# Patient Record
Sex: Female | Born: 1955
Health system: Southern US, Community
[De-identification: ages and names within clinical notes are randomized; demographics above are authoritative.]

## PROBLEM LIST (undated history)

## (undated) DIAGNOSIS — F419 Anxiety disorder, unspecified: Secondary | ICD-10-CM

## (undated) DIAGNOSIS — I10 Essential (primary) hypertension: Secondary | ICD-10-CM

## (undated) DIAGNOSIS — M47816 Spondylosis without myelopathy or radiculopathy, lumbar region: Secondary | ICD-10-CM

## (undated) DIAGNOSIS — C4490 Unspecified malignant neoplasm of skin, unspecified: Secondary | ICD-10-CM

## (undated) DIAGNOSIS — Z8619 Personal history of other infectious and parasitic diseases: Secondary | ICD-10-CM

## (undated) DIAGNOSIS — M1610 Unilateral primary osteoarthritis, unspecified hip: Secondary | ICD-10-CM

## (undated) DIAGNOSIS — T7840XA Allergy, unspecified, initial encounter: Secondary | ICD-10-CM

## (undated) HISTORY — DX: Essential (primary) hypertension: I10

## (undated) HISTORY — DX: Anxiety disorder, unspecified: F41.9

## (undated) HISTORY — DX: Allergy, unspecified, initial encounter: T78.40XA

## (undated) HISTORY — DX: Unilateral primary osteoarthritis, unspecified hip: M16.10

## (undated) HISTORY — PX: APPENDECTOMY: SHX54

## (undated) HISTORY — PX: JOINT REPLACEMENT: SHX530

## (undated) HISTORY — DX: Spondylosis without myelopathy or radiculopathy, lumbar region: M47.816

## (undated) HISTORY — DX: Personal history of other infectious and parasitic diseases: Z86.19

## (undated) HISTORY — DX: Unspecified malignant neoplasm of skin, unspecified: C44.90

---

## 2013-09-11 ENCOUNTER — Ambulatory Visit: Payer: BC Managed Care – PPO

## 2014-01-16 ENCOUNTER — Ambulatory Visit (INDEPENDENT_AMBULATORY_CARE_PROVIDER_SITE_OTHER): Payer: BC Managed Care – PPO | Admitting: Emergency Medicine

## 2014-01-16 VITALS — BP 106/80 | HR 69 | Temp 98.3°F | Resp 18 | Ht 65.0 in | Wt 131.0 lb

## 2014-01-16 DIAGNOSIS — F411 Generalized anxiety disorder: Secondary | ICD-10-CM

## 2014-01-16 DIAGNOSIS — I10 Essential (primary) hypertension: Secondary | ICD-10-CM | POA: Insufficient documentation

## 2014-01-16 DIAGNOSIS — Z79899 Other long term (current) drug therapy: Secondary | ICD-10-CM

## 2014-01-16 DIAGNOSIS — D649 Anemia, unspecified: Secondary | ICD-10-CM | POA: Insufficient documentation

## 2014-01-16 LAB — COMPREHENSIVE METABOLIC PANEL
ALK PHOS: 68 U/L (ref 39–117)
ALT: 15 U/L (ref 0–35)
AST: 22 U/L (ref 0–37)
Albumin: 4.4 g/dL (ref 3.5–5.2)
BUN: 14 mg/dL (ref 6–23)
CO2: 28 mEq/L (ref 19–32)
CREATININE: 0.63 mg/dL (ref 0.50–1.10)
Calcium: 10.1 mg/dL (ref 8.4–10.5)
Chloride: 101 mEq/L (ref 96–112)
Glucose, Bld: 89 mg/dL (ref 70–99)
Potassium: 4.3 mEq/L (ref 3.5–5.3)
Sodium: 139 mEq/L (ref 135–145)
Total Bilirubin: 0.3 mg/dL (ref 0.2–1.2)
Total Protein: 7.1 g/dL (ref 6.0–8.3)

## 2014-01-16 LAB — POCT CBC
Granulocyte percent: 43.9 %G (ref 37–80)
HCT, POC: 33.8 % — AB (ref 37.7–47.9)
Hemoglobin: 11 g/dL — AB (ref 12.2–16.2)
LYMPH, POC: 2.8 (ref 0.6–3.4)
MCH: 32.3 pg — AB (ref 27–31.2)
MCHC: 32.5 g/dL (ref 31.8–35.4)
MCV: 99.3 fL — AB (ref 80–97)
MID (CBC): 0.5 (ref 0–0.9)
MPV: 6.4 fL (ref 0–99.8)
PLATELET COUNT, POC: 305 10*3/uL (ref 142–424)
POC Granulocyte: 2.5 (ref 2–6.9)
POC LYMPH PERCENT: 48.1 %L (ref 10–50)
POC MID %: 8 %M (ref 0–12)
RBC: 3.41 M/uL — AB (ref 4.04–5.48)
RDW, POC: 13.5 %
WBC: 5.8 10*3/uL (ref 4.6–10.2)

## 2014-01-16 LAB — FERRITIN: Ferritin: 342 ng/mL — ABNORMAL HIGH (ref 10–291)

## 2014-01-16 LAB — IRON AND TIBC
%SAT: 28 % (ref 20–55)
IRON: 98 ug/dL (ref 42–145)
TIBC: 356 ug/dL (ref 250–470)
UIBC: 258 ug/dL (ref 125–400)

## 2014-01-16 LAB — VITAMIN B12: Vitamin B-12: 564 pg/mL (ref 211–911)

## 2014-01-16 MED ORDER — PROPRANOLOL HCL 20 MG PO TABS: 20.0000 mg | ORAL_TABLET | Freq: Every day | ORAL | Status: DC

## 2014-01-16 MED ORDER — ALPRAZOLAM 0.5 MG PO TABS: ORAL_TABLET | ORAL | Status: DC

## 2014-01-16 MED ORDER — HYDROCHLOROTHIAZIDE 12.5 MG PO TABS: ORAL_TABLET | ORAL | Status: DC

## 2014-01-16 MED ORDER — LISINOPRIL 20 MG PO TABS: 20.0000 mg | ORAL_TABLET | Freq: Every day | ORAL | Status: DC

## 2014-01-16 NOTE — Progress Notes (Addendum)
Subjective:    Patient ID: Princess Bruins, female    DOB: 1955/12/17, 58 y.o.   MRN: [id number] This chart was scribed for Darlyne Russian, MD by Cathie Hoops, ED Scribe. The patient was seen in Room 2. The patient's care was started at 2:13 PM.   01/16/2014  Chief Complaint  Patient presents with  . rx refills    all in epic    HPI HPI Comments: Elanda Garmany is a 58 y.o. female who presents to the [facility] complaining of chronic HTN and anxiety. Pt notes she attempted to go to a concert in [place]. Pt notes she got stuck in traffic and ended up being 2 hours late for the concert. As she tried to return home she got lost and notes her "nerves got worked up". Pt denies having her HTN or anxiety medications at that time and her BP became elevated. Pt states she went to the ED in [place], [place] via EMS and notes she went to the hospital for HTN. Pt notes she was shaky and hands were white. Pt notes she was told to follow-up with a physician 10 days after the incident. Pt would like medication refills of Xanax, Hydrodiuril, Prinivil, and Inderal. She states she moved to [place] from [place] about 6 months ago. Pt's medication was written in [place]. She notes she monitors her BP at home with an at-home BP cuff. She denies having her potassium levels or blood-work checked recently due to lack of medical insurance. She denies any other symptoms at this time.   Review of Systems  Constitutional: Negative for fever and chills.  Cardiovascular: Negative for chest pain.  Gastrointestinal: Negative for nausea and vomiting.  Psychiatric/Behavioral: The patient is nervous/anxious.     Past Medical History  Diagnosis Date  . Allergy   . Anxiety   . Hypertension    Past Surgical History  Procedure Laterality Date  . Appendectomy     Allergies  Allergen Reactions  . Adhesive [Tape]   . Erythromycin   . Prozac [Fluoxetine Hcl] Hives  . Sulfa Antibiotics     Current Outpatient Prescriptions  Medication Sig Dispense Refill  . ALPRAZolam (XANAX) 0.5 MG tablet Take 0.5 mg by mouth 3 (three) times daily as needed for anxiety.    . hydrochlorothiazide (HYDRODIURIL) 25 MG tablet Take 25 mg by mouth daily.    Marland Kitchen lisinopril (PRINIVIL,ZESTRIL) 20 MG tablet Take 20 mg by mouth daily.    . propranolol (INDERAL) 20 MG tablet Take 20 mg by mouth daily.     No current facility-administered medications for this visit.       Objective:  Triage Vitals: BP 106/80 mmHg  Pulse 69  Temp(Src) 98.3 F (36.8 C) (Oral)  Resp 18  Ht 5\' 5"  (1.651 m)  Wt 131 lb (59.421 kg)  BMI 21.80 kg/m2  SpO2 100%  Physical Exam  Constitutional: She is oriented to person, place, and time. She appears well-developed and well-nourished. No distress.  HENT:  Head: Normocephalic and atraumatic.  Eyes: Conjunctivae and EOM are normal.  Neck: Neck supple. No tracheal deviation present.  Cardiovascular: Normal rate.   Pulmonary/Chest: Effort normal. No respiratory distress.  Musculoskeletal: Normal range of motion.  Neurological: She is alert and oriented to person, place, and time.  Skin: Skin is warm and dry.  Psychiatric: She has a normal mood and affect. Her behavior is normal.  Nursing note and vitals reviewed.   Results for orders placed or  performed in visit on 01/16/14  POCT CBC  Result Value Ref Range   WBC 5.8 4.6 - 10.2 K/uL   Lymph, poc 2.8 0.6 - 3.4   POC LYMPH PERCENT 48.1 10 - 50 %L   MID (cbc) 0.5 0 - 0.9   POC MID % 8.0 0 - 12 %M   POC Granulocyte 2.5 2 - 6.9   Granulocyte percent 43.9 37 - 80 %G   RBC 3.41 (A) 4.04 - 5.48 M/uL   Hemoglobin 11.0 (A) 12.2 - 16.2 g/dL   HCT, POC 33.8 (A) 37.7 - 47.9 %   MCV 99.3 (A) 80 - 97 fL   MCH, POC 32.3 (A) 27 - 31.2 pg   MCHC 32.5 31.8 - 35.4 g/dL   RDW, POC 13.5 %   Platelet Count, POC 305 142 - 424 K/uL   MPV 6.4 0 - 99.8 fL    Assessment & Plan:  2:32 PM- Patient informed of current plan for  treatment and evaluation and agrees with plan at this time. Her blood pressure medications were refilled. I pulled up the drug data sheet and she is not on the [facility]. I gave her #45 of Xanax 0.5 to take up to 2 a day she was given 45 tablets with one refill. She denied any suicidal thoughts she is here without family care of her pets and is looking for a job. I advised her to make an appointment next or so she can get follow-up of her anxiety stress and hypertension.I personally performed the services described in this documentation, which was scribed in my presence. The recorded information has been reviewed and is accurate. Hemoglobin was low at 11. I did give her Hemoccult cards. Iron studies were ordered along with a B12. Her MCV was 99.3 and she does have one glass of wine per night.

## 2014-01-16 NOTE — Patient Instructions (Signed)
Hypertension Hypertension, commonly called high blood pressure, is when the force of blood pumping through your arteries is too strong. Your arteries are the blood vessels that carry blood from your heart throughout your body. A blood pressure reading consists of a higher number over a lower number, such as 110/72. The higher number (systolic) is the pressure inside your arteries when your heart pumps. The lower number (diastolic) is the pressure inside your arteries when your heart relaxes. Ideally you want your blood pressure below 120/80. Hypertension forces your heart to work harder to pump blood. Your arteries may become narrow or stiff. Having hypertension puts you at risk for heart disease, stroke, and other problems.  RISK FACTORS Some risk factors for high blood pressure are controllable. Others are not. Stress Stress-related medical problems are becoming increasingly common. The body has a built-in physical response to stressful situations. Faced with pressure, challenge or danger, we need to react quickly. Our bodies release hormones such as cortisol and adrenaline to help do this. These hormones are part of the "fight or flight" response and affect the metabolic rate, heart rate and blood pressure, resulting in a heightened, stressed state that prepares the body for optimum performance in dealing with a stressful situation. It is likely that early man required these mechanisms to stay alive, but usually modern stresses do not call for this, and the same hormones released in today's world can damage health and reduce coping ability. CAUSES  Pressure to perform at work, at school or in sports.  Threats of physical violence.  Money worries.  Arguments.  Family conflicts.  Divorce or separation from significant other.  Bereavement.  New job or unemployment.  Changes in location.  Alcohol or drug abuse. SOMETIMES, THERE IS NO PARTICULAR REASON FOR DEVELOPING STRESS. Almost all  people are at risk of being stressed at some time in their lives. It is important to know that some stress is temporary and some is long term.  Temporary stress will go away when a situation is resolved. Most people can cope with short periods of stress, and it can often be relieved by relaxing, taking a walk or getting any type of exercise, chatting through issues with friends, or having a good night's sleep.  Chronic (long-term, continuous) stress is much harder to deal with. It can be psychologically and emotionally damaging. It can be harmful both for an individual and for friends and family. SYMPTOMS Everyone reacts to stress differently. There are some common effects that help [place] recognize it. In times of extreme stress, people may:  Shake uncontrollably.  Breathe faster and deeper than normal (hyperventilate).  Vomit.  For people with asthma, stress can trigger an attack.  For some people, stress may trigger migraine headaches, ulcers, and body pain. PHYSICAL EFFECTS OF STRESS MAY INCLUDE:  Loss of energy.  Skin problems.  Aches and pains resulting from tense muscles, including neck ache, backache and tension headaches.  Increased pain from arthritis and other conditions.  Irregular heart beat (palpitations).  Periods of irritability or anger.  Apathy or depression.  Anxiety (feeling uptight or worrying).  Unusual behavior.  Loss of appetite.  Comfort eating.  Lack of concentration.  Loss of, or decreased, sex-drive.  Increased smoking, drinking, or recreational drug use.  For women, missed periods.  Ulcers, joint pain, and muscle pain. Post-traumatic stress is the stress caused by any serious accident, strong emotional damage, or extremely difficult or violent experience such as rape or war. Post-traumatic stress victims can  experience mixtures of emotions such as fear, shame, depression, guilt or anger. It may include recurrent memories or images that may  be haunting. These feelings can last for weeks, months or even years after the traumatic event that triggered them. Specialized treatment, possibly with medicines and psychological therapies, is available. If stress is causing physical symptoms, severe distress or making it difficult for you to function as normal, it is worth seeing your caregiver. It is important to remember that although stress is a usual part of life, extreme or prolonged stress can lead to other illnesses that will need treatment. It is better to visit a doctor sooner rather than later. Stress has been linked to the development of high blood pressure and heart disease, as well as insomnia and depression. There is no diagnostic test for stress since everyone reacts to it differently. But a caregiver will be able to spot the physical symptoms, such as:  Headaches.  Shingles.  Ulcers. Emotional distress such as intense worry, low mood or irritability should be detected when the doctor asks pertinent questions to identify any underlying problems that might be the cause. In case there are physical reasons for the symptoms, the doctor may also want to do some tests to exclude certain conditions. If you feel that you are suffering from stress, try to identify the aspects of your life that are causing it. Sometimes you may not be able to change or avoid them, but even a small change can have a positive ripple effect. A simple lifestyle change can make all the difference. STRATEGIES THAT CAN HELP DEAL WITH STRESS:  Delegating or sharing responsibilities.  Avoiding confrontations.  Learning to be more assertive.  Regular exercise.  Avoid using alcohol or street drugs to cope.  Eating a healthy, balanced diet, rich in fruit and vegetables and proteins.  Finding humor or absurdity in stressful situations.  Never taking on more than you know you can handle comfortably.  Organizing your time better to get as much done as  possible.  Talking to friends or family and sharing your thoughts and fears.  Listening to music or relaxation tapes.  Relaxation techniques like deep breathing, meditation, and yoga.  Tensing and then relaxing your muscles, starting at the toes and working up to the head and neck. If you think that you would benefit from help, either in identifying the things that are causing your stress or in learning techniques to help you relax, see a caregiver who is capable of helping you with this. Rather than relying on medications, it is usually better to try and identify the things in your life that are causing stress and try to deal with them. There are many techniques of managing stress including counseling, psychotherapy, aromatherapy, yoga, and exercise. Your caregiver can help you determine what is best for you. Document Released: 05/05/2002 Document Revised: 02/17/2013 Document Reviewed: 04/01/2007 [facility] Patient Information 2015 [place], [place]. This information is not intended to replace advice given to you by your health care provider. Make sure you discuss any questions you have with your health care provider.  Risk factors you cannot control include:   Race. You may be at higher risk if you are African American.  Age. Risk increases with age.  Gender. Men are at higher risk than women before age 37 years. After age 83, women are at higher risk than men. Risk factors you can control include:  Not getting enough exercise or physical activity.  Being overweight.  Getting too much  fat, sugar, calories, or salt in your diet.  Drinking too much alcohol. SIGNS AND SYMPTOMS Hypertension does not usually cause signs or symptoms. Extremely high blood pressure (hypertensive crisis) may cause headache, anxiety, shortness of breath, and nosebleed. DIAGNOSIS  To check if you have hypertension, your health care provider will measure your blood pressure while you are seated, with your arm held  at the level of your heart. It should be measured at least twice using the same arm. Certain conditions can cause a difference in blood pressure between your right and left arms. A blood pressure reading that is higher than normal on one occasion does not mean that you need treatment. If one blood pressure reading is high, ask your health care provider about having it checked again. TREATMENT  Treating high blood pressure includes making lifestyle changes and possibly taking medicine. Living a healthy lifestyle can help lower high blood pressure. You may need to change some of your habits. Lifestyle changes may include:  Following the DASH diet. This diet is high in fruits, vegetables, and whole grains. It is low in salt, red meat, and added sugars.  Getting at least 2 hours of brisk physical activity every week.  Losing weight if necessary.  Not smoking.  Limiting alcoholic beverages.  Learning ways to reduce stress. If lifestyle changes are not enough to get your blood pressure under control, your health care provider may prescribe medicine. You may need to take more than one. Work closely with your health care provider to understand the risks and benefits. HOME CARE INSTRUCTIONS  Have your blood pressure rechecked as directed by your health care provider.   Take medicines only as directed by your health care provider. Follow the directions carefully. Blood pressure medicines must be taken as prescribed. The medicine does not work as well when you skip doses. Skipping doses also puts you at risk for problems.   Do not smoke.   Monitor your blood pressure at home as directed by your health care provider. SEEK MEDICAL CARE IF:   You think you are having a reaction to medicines taken.  You have recurrent headaches or feel dizzy.  You have swelling in your ankles.  You have trouble with your vision. SEEK IMMEDIATE MEDICAL CARE IF:  You develop a severe headache or  confusion.  You have unusual weakness, numbness, or feel faint.  You have severe chest or abdominal pain.  You vomit repeatedly.  You have trouble breathing. MAKE SURE YOU:   Understand these instructions.  Will watch your condition.  Will get help right away if you are not doing well or get worse. Document Released: 02/12/2005 Document Revised: 06/29/2013 Document Reviewed: 12/05/2012 [facility] Patient Information 2015 [place], [place]. This information is not intended to replace advice given to you by your health care provider. Make sure you discuss any questions you have with your health care provider.

## 2014-01-18 ENCOUNTER — Encounter: Payer: Self-pay | Admitting: Family Medicine

## 2014-01-18 ENCOUNTER — Telehealth: Payer: Self-pay

## 2014-01-18 ENCOUNTER — Ambulatory Visit (INDEPENDENT_AMBULATORY_CARE_PROVIDER_SITE_OTHER): Payer: BC Managed Care – PPO | Admitting: Family Medicine

## 2014-01-18 VITALS — BP 127/77 | HR 58 | Temp 97.6°F | Resp 16 | Ht 65.0 in | Wt 132.2 lb

## 2014-01-18 DIAGNOSIS — D649 Anemia, unspecified: Secondary | ICD-10-CM

## 2014-01-18 NOTE — Progress Notes (Signed)
Patient has made an appointment with Tor Netters to go over her lab results.

## 2014-01-18 NOTE — Telephone Encounter (Signed)
Lm for rtn call 

## 2014-01-18 NOTE — Progress Notes (Signed)
   Subjective:    Patient ID: Dawn Casey, female    DOB: 17-Jun-1955, 58 y.o.   MRN: [id number]  HPI Patient presents today to discuss lab work done earlier this week. Was seen by Dr. Everlene Farrier 2 days ago for follow up anxiety. Her labs showed anemia and the patient presents today to discuss the results.   She was given hemoccult cards to complete and is following the diet for collection and intends to collect the samples and bring in.   Review of Systems No chest pain, no SOB, no dizziness, no headache.     Objective:   Physical Exam  Constitutional: She is oriented to person, place, and time. She appears well-developed and well-nourished.  HENT:  Head: Normocephalic and atraumatic.  Eyes: Conjunctivae are normal.  Neck: Normal range of motion.  Cardiovascular: Normal rate.   Pulmonary/Chest: Effort normal.  Musculoskeletal: Normal range of motion.  Neurological: She is alert and oriented to person, place, and time.  Psychiatric: She has a normal mood and affect. Her behavior is normal. Judgment and thought content normal.  Vitals reviewed.     Assessment & Plan:  1. Anemia, unspecified anemia type -reviewed results with patient -she will complete hemoccult cards -repeat CBC in 4-6 weeks, return if she has any new dizziness, weakness  Elby Beck, FNP-BC  [facility]  01/19/2014 8:39 AM

## 2014-01-18 NOTE — Telephone Encounter (Signed)
Patient would like for someone to call her back and talk with her regarding her lab results.  Patient was told to make an appointment to discuss labs further but patient is unsure why she need to make an appointment.  Please someone give patient a call back to review her lab results so patient is not worried about her labs until she can come in tomorrow and see Tor Netters.  Best#: [phone]

## 2014-01-19 ENCOUNTER — Ambulatory Visit: Payer: BC Managed Care – PPO | Admitting: Family Medicine

## 2014-01-20 NOTE — Telephone Encounter (Signed)
LM for pt to RTC per lab notes.  Pt had numerous questions and it was advise pt to RTC to discuss these results by the provider.

## 2014-01-21 NOTE — Telephone Encounter (Signed)
She has already seen [place]

## 2014-02-02 LAB — HEMOCCULT GUIAC POC 1CARD (OFFICE)
Card #3 Fecal Occult Blood, POC: POSITIVE
FECAL OCCULT BLD: POSITIVE
Fecal Occult Blood, POC: POSITIVE

## 2014-04-28 ENCOUNTER — Ambulatory Visit (INDEPENDENT_AMBULATORY_CARE_PROVIDER_SITE_OTHER): Payer: BLUE CROSS/BLUE SHIELD | Admitting: Urgent Care

## 2014-04-28 ENCOUNTER — Encounter: Payer: Self-pay | Admitting: Urgent Care

## 2014-04-28 VITALS — BP 118/81 | HR 65 | Temp 99.0°F | Resp 16 | Ht 65.0 in | Wt 135.0 lb

## 2014-04-28 DIAGNOSIS — L03019 Cellulitis of unspecified finger: Secondary | ICD-10-CM

## 2014-04-28 DIAGNOSIS — IMO0002 Reserved for concepts with insufficient information to code with codable children: Secondary | ICD-10-CM

## 2014-04-28 MED ORDER — MUPIROCIN 2 % EX OINT: 1.0000 "application " | TOPICAL_OINTMENT | Freq: Three times a day (TID) | CUTANEOUS | Status: AC

## 2014-04-28 NOTE — Progress Notes (Signed)
    MRN: [id number] DOB: 28-Dec-1955  Subjective:   Dawn Casey is a 59 y.o. female presenting for chief complaint of Nail Problem  Reports 2 week history of "infected nail". Patient uses "fake nails" regularly. About 3 weeks ago, she applied a new set of nails and a few days later started to feel some pain in her left middle finger so she removed her nails. States that her nail was discolored in both her left middle finger and right ring finger. She removed that portion of nail and a little bit of the skin in the surrounding area and reports that since she has seen steady improvement of her nail. Denies fevers, erythema, swelling, pain, discharge, bleeding, decreased ROM or sensation. Denies any other aggravating or relieving factors, no other questions or concerns.  Jeani Hawking has a current medication list which includes the following prescription(s): alprazolam, hydrochlorothiazide, lisinopril, and propranolol.   is allergic to adhesive; erythromycin; prozac; and sulfa antibiotics.  Jeani Hawking  has a past medical history of Allergy; Anxiety; and Hypertension. Also  has past surgical history that includes Appendectomy.  ROS As in subjective.  Objective:   Vitals: BP 118/81 mmHg  Pulse 65  Temp(Src) 99 F (37.2 C)  Resp 16  Ht 5\' 5"  (1.651 m)  Wt 135 lb (61.236 kg)  BMI 22.47 kg/m2  SpO2 100%  Physical Exam  Constitutional: She is oriented to person, place, and time and well-developed, well-nourished, and in no distress.  Cardiovascular: Normal rate.   Pulmonary/Chest: Effort normal.  Musculoskeletal:       Left hand: She exhibits normal range of motion, no tenderness, no bony tenderness, normal capillary refill, no deformity, no laceration and no swelling. Normal sensation noted. Normal strength noted.       Hands: Neurological: She is alert and oriented to person, place, and time.  Skin: Skin is warm and dry. No rash noted. No erythema. No pallor.   Assessment and Plan :   1.  Paronychia, unspecified laterality - Stable, well-healed, paronychia likely due to artificial nails used by patient. Offending agent removed by patient and physical exam reassuring, no culture obtained. Advised to start Bactroban cream TID x7 days, advised if signs of infection develop to call me and I would prescribe oral antibiotic. - Otherwise return as needed.  Jaynee Eagles, PA-C [facility] [phone] 04/28/2014 1:17 PM

## 2014-04-28 NOTE — Patient Instructions (Addendum)
-   Apply the antibiotic cream to your middle left finger and right ring finger 3 times a day for seven days.  - Keep your hands clean and dry and avoid using nails until your infection is fully healed. - If you start getting fevers, swelling, redness or pain in your finger, please let me know and we will try an oral antibiotic.    Fingertip Infection When an infection is around the nail, it is called a paronychia. When it appears over the tip of the finger, it is called a felon. These infections are due to minor injuries or cracks in the skin. If they are not treated properly, they can lead to bone infection and permanent damage to the fingernail. Incision and drainage is necessary if a pus pocket (an abscess) has formed. Antibiotics and pain medicine may also be needed. Keep your hand elevated for the next 2-3 days to reduce swelling and pain. If a pack was placed in the abscess, it should be removed in 1-2 days by your caregiver. Soak the finger in warm water for 20 minutes 4 times daily to help promote drainage. Keep the hands as dry as possible. Wear protective gloves with cotton liners. See your caregiver for follow-up care as recommended.  HOME CARE INSTRUCTIONS   Keep wound clean, dry and dressed as suggested by your caregiver.  Soak in warm salt water for fifteen minutes, four times per day for bacterial infections.  Your caregiver will prescribe an antibiotic if a bacterial infection is suspected. Take antibiotics as directed and finish the prescription, even if the problem appears to be improving before the medicine is gone.  Only take over-the-counter or prescription medicines for pain, discomfort, or fever as directed by your caregiver. SEEK IMMEDIATE MEDICAL CARE IF:  There is redness, swelling, or increasing pain in the wound.  Pus or any other unusual drainage is coming from the wound.  An unexplained oral temperature above 102 F (38.9 C) develops.  You notice a foul smell  coming from the wound or dressing. MAKE SURE YOU:   Understand these instructions.  Monitor your condition.  Contact your caregiver if you are getting worse or not improving. Document Released: 03/22/2004 Document Revised: 05/07/2011 Document Reviewed: 03/18/2008 [facility] Patient Information 2015 [place], [place]. This information is not intended to replace advice given to you by your health care provider. Make sure you discuss any questions you have with your health care provider.

## 2014-07-31 ENCOUNTER — Other Ambulatory Visit: Payer: Self-pay | Admitting: Emergency Medicine

## 2014-09-01 ENCOUNTER — Telehealth: Payer: Self-pay

## 2014-09-01 NOTE — Telephone Encounter (Signed)
Pt called wanting to know what labs she's had done in the last year. Told her what she had done

## 2014-09-08 NOTE — Telephone Encounter (Signed)
Pt called back requesting those labs be faxed to [facility]

## 2014-10-11 ENCOUNTER — Other Ambulatory Visit: Payer: Self-pay | Admitting: Emergency Medicine

## 2014-10-11 ENCOUNTER — Other Ambulatory Visit: Payer: Self-pay | Admitting: Urgent Care

## 2014-12-02 ENCOUNTER — Ambulatory Visit (INDEPENDENT_AMBULATORY_CARE_PROVIDER_SITE_OTHER): Payer: BLUE CROSS/BLUE SHIELD | Admitting: Physician Assistant

## 2014-12-06 ENCOUNTER — Encounter: Payer: Self-pay | Admitting: Family Medicine

## 2014-12-06 ENCOUNTER — Ambulatory Visit (INDEPENDENT_AMBULATORY_CARE_PROVIDER_SITE_OTHER): Payer: BLUE CROSS/BLUE SHIELD | Admitting: Family Medicine

## 2014-12-06 VITALS — BP 112/79 | HR 66 | Temp 98.6°F | Resp 16 | Ht 65.0 in | Wt 132.0 lb

## 2014-12-06 DIAGNOSIS — S39011A Strain of muscle, fascia and tendon of abdomen, initial encounter: Secondary | ICD-10-CM | POA: Diagnosis not present

## 2014-12-06 DIAGNOSIS — L609 Nail disorder, unspecified: Secondary | ICD-10-CM | POA: Diagnosis not present

## 2014-12-06 DIAGNOSIS — I10 Essential (primary) hypertension: Secondary | ICD-10-CM

## 2014-12-06 DIAGNOSIS — R195 Other fecal abnormalities: Secondary | ICD-10-CM | POA: Diagnosis not present

## 2014-12-06 DIAGNOSIS — Z1231 Encounter for screening mammogram for malignant neoplasm of breast: Secondary | ICD-10-CM | POA: Diagnosis not present

## 2014-12-06 DIAGNOSIS — D649 Anemia, unspecified: Secondary | ICD-10-CM

## 2014-12-06 DIAGNOSIS — F4323 Adjustment disorder with mixed anxiety and depressed mood: Secondary | ICD-10-CM | POA: Diagnosis not present

## 2014-12-06 DIAGNOSIS — S76219A Strain of adductor muscle, fascia and tendon of unspecified thigh, initial encounter: Secondary | ICD-10-CM

## 2014-12-06 LAB — CBC
HEMATOCRIT: 32.3 % — AB (ref 36.0–46.0)
HEMOGLOBIN: 11.2 g/dL — AB (ref 12.0–15.0)
MCH: 33.2 pg (ref 26.0–34.0)
MCHC: 34.7 g/dL (ref 30.0–36.0)
MCV: 95.8 fL (ref 78.0–100.0)
MPV: 8.7 fL (ref 8.6–12.4)
PLATELETS: 358 10*3/uL (ref 150–400)
RBC: 3.37 MIL/uL — AB (ref 3.87–5.11)
RDW: 12.2 % (ref 11.5–15.5)
WBC: 5.4 10*3/uL (ref 4.0–10.5)

## 2014-12-06 LAB — COMPREHENSIVE METABOLIC PANEL
ALBUMIN: 4.6 g/dL (ref 3.6–5.1)
ALK PHOS: 68 U/L (ref 33–130)
ALT: 13 U/L (ref 6–29)
AST: 19 U/L (ref 10–35)
BUN: 8 mg/dL (ref 7–25)
CALCIUM: 9.9 mg/dL (ref 8.6–10.4)
CHLORIDE: 92 mmol/L — AB (ref 98–110)
CO2: 29 mmol/L (ref 20–31)
Creat: 0.7 mg/dL (ref 0.50–1.05)
Glucose, Bld: 96 mg/dL (ref 65–99)
POTASSIUM: 4.1 mmol/L (ref 3.5–5.3)
Sodium: 134 mmol/L — ABNORMAL LOW (ref 135–146)
TOTAL PROTEIN: 6.9 g/dL (ref 6.1–8.1)
Total Bilirubin: 0.4 mg/dL (ref 0.2–1.2)

## 2014-12-06 MED ORDER — LISINOPRIL 20 MG PO TABS: 20.0000 mg | ORAL_TABLET | Freq: Every day | ORAL | Status: DC

## 2014-12-06 MED ORDER — PROPRANOLOL HCL 20 MG PO TABS: 20.0000 mg | ORAL_TABLET | Freq: Every day | ORAL | Status: DC

## 2014-12-06 MED ORDER — HYDROCHLOROTHIAZIDE 12.5 MG PO TABS: 12.5000 mg | ORAL_TABLET | Freq: Every day | ORAL | Status: DC

## 2014-12-06 MED ORDER — ALPRAZOLAM 0.5 MG PO TABS: ORAL_TABLET | ORAL | Status: DC

## 2014-12-06 NOTE — Progress Notes (Signed)
Subjective:    Patient ID: [name], female    DOB: [birth year], 60 y.o.   MRN: [id number]  HPI This is a pleasant 59 yo female who presents today for follow up of HTN, anxiety, bilateral nail problems, right groin pain.   HTN- she has been taking HCTZ, lisinopril and propranolol. Last several BP readings good, patient is interested in decreasing her medications if possible. She takes propranolol to help with anxiety which she thinks helps.   Anxiety- She has recently broken up with her fiance and is in therapy which is helping. The break up was quite sudden and she was quite traumatized from it. She has found a new apartment and is coming to realize that she was in an unhealthy relationship. She requests a refill of her Xanax which she takes once a day most days. She anticipates needing to use a little more frequently as she is going to start singing in public more which she enjoys but causes her anxiety.   Bilateral nail defect- she was seen 04/28/14 with defects in her nails due to artificial nails. They have grown out nicely, but she has a small bruise under one nail that she would like looked at. She thinks she injured it while moving recently.   Right groin pain- she developed groin pain following her move, likely related to heavy lifting. It has gotten better over the last 2 weeks. No numbness/tingling, no weakness, no falls. No back pain. Has not needed any medication for pain.   Anemia- patient was seen 01/16/14 with anemia. She was given hemoccult cards which she returned 02/02/14. Her hemoccult cards were positive. I can not find any record that the patient was notified of these results and the patient reports that she was not called. She is not interested in seeing GI. She denies any fatigue, weakness, lightheadedness, dizziness.   Review of Systems Per HPI    Objective:   Physical Exam Physical Exam  Constitutional: Oriented to person, place, and time. She appears  well-developed and well-nourished.  HENT:  Head: Normocephalic and atraumatic.  Eyes: Conjunctivae are normal.  Neck: Normal range of motion. Neck supple.  Cardiovascular: Normal rate, regular rhythm and normal heart sounds.   Pulmonary/Chest: Effort normal and breath sounds normal.  Musculoskeletal: Normal range of motion. Both hips with good ROM, strength, unable to produce pain with palpation. Neurological: Alert and oriented to person, place, and time.  Skin: Skin is warm and dry. Right third fingernail with very small bruise in middle of nail. All nails with normal new growth.  Psychiatric: Normal mood and affect. Behavior is normal. Judgment and thought content normal.  Vitals reviewed.  BP 112/79 mmHg  Pulse 66  Temp(Src) 98.6 F (37 C) (Oral)  Resp 16  Ht 5\' 5"  (1.651 m)  Wt 132 lb (59.875 kg)  BMI 21.97 kg/m2  SpO2 95% Wt Readings from Last 3 Encounters:  12/06/14 132 lb (59.875 kg)  04/28/14 135 lb (61.236 kg)  01/18/14 132 lb 3.2 oz (59.966 kg)   Depression screen [facility] 2/9 12/06/2014 01/18/2014  Decreased Interest 0 0  Down, Depressed, Hopeless 0 0  PHQ - 2 Score 0 0       Assessment & Plan:  1. Anemia, unspecified anemia type - discussed importance of GI consult as anemia may be related to colon cancer - CBC - Comprehensive metabolic panel - Ambulatory referral to Gastroenterology  2. Visit for screening mammogram - provided patient with information, but patient not  interested in having mammo at this time due to other things going on, encouraged patient to make appointment  3. Essential hypertension - reviewed patient's recent normal readings. She can stop HCTZ and continue to monitor BP.  - lisinopril (PRINIVIL,ZESTRIL) 20 MG tablet; Take 1 tablet (20 mg total) by mouth daily.  Dispense: 90 tablet; Refill: 1 - propranolol (INDERAL) 20 MG tablet; Take 1 tablet (20 mg total) by mouth daily.  Dispense: 90 tablet; Refill: 1 - Comprehensive metabolic panel  4.  Adjustment disorder with mixed anxiety and depressed mood - ALPRAZolam (XANAX) 0.5 MG tablet; Take 1 tablet daily as needed for stress and anxiety. Patient can take up to 2 tablets a day if necessary.  Dispense: 90 tablet; Refill: 1  5. Groin strain, initial encounter - getting better, suggested BID rom, moist heat  6. Fingernail abnormalities - new nail growth normal  7. Occult GI bleeding - Ambulatory referral to Gastroenterology   Clarene Reamer, FNP-BC  [facility]  12/08/2014 6:07 AM

## 2014-12-06 NOTE — Patient Instructions (Addendum)
We recommend that you schedule a mammogram for breast cancer screening. Typically, you do not need a referral to do this. Please contact a local imaging center to schedule your mammogram.  [facility] - [phone]  *ask for the Radiology [place] ([place]) - [phone] or [phone]  [facility] - [phone] [phone] [facility] - [phone]  *ask for the [facility] - [phone]  *ask for the Radiology Department [facility] - [phone]  *ask for the [place] - [phone]  For your allergies you can try OTC claritin or zyrtec- generic is fine  Allergic Rhinitis Allergic rhinitis is when the mucous membranes in the nose respond to allergens. Allergens are particles in the air that cause your body to have an allergic reaction. This causes you to release allergic antibodies. Through a chain of events, these eventually cause you to release histamine into the blood stream. Although meant to protect the body, it is this release of histamine that causes your discomfort, such as frequent sneezing, congestion, and an itchy, runny nose.  CAUSES Seasonal allergic rhinitis (hay fever) is caused by pollen allergens that may come from grasses, trees, and weeds. Year-round allergic rhinitis (perennial allergic rhinitis) is caused by allergens such as house dust mites, pet dander, and mold spores. SYMPTOMS  Nasal stuffiness (congestion).  Itchy, runny nose with sneezing and tearing of the eyes. DIAGNOSIS Your health care provider can help you determine the allergen or allergens that trigger your symptoms. If you and your health care provider are unable to determine the allergen, skin or blood testing may be used. Your health care provider will diagnose your condition after taking your health history and  performing a physical exam. Your health care provider may assess you for other related conditions, such as asthma, pink eye, or an ear infection. TREATMENT Allergic rhinitis does not have a cure, but it can be controlled by:  Medicines that block allergy symptoms. These may include allergy shots, nasal sprays, and oral antihistamines.  Avoiding the allergen. Hay fever may often be treated with antihistamines in pill or nasal spray forms. Antihistamines block the effects of histamine. There are over-the-counter medicines that may help with nasal congestion and swelling around the eyes. Check with your health care provider before taking or giving this medicine. If avoiding the allergen or the medicine prescribed do not work, there are many new medicines your health care provider can prescribe. Stronger medicine may be used if initial measures are ineffective. Desensitizing injections can be used if medicine and avoidance does not work. Desensitization is when a patient is given ongoing shots until the body becomes less sensitive to the allergen. Make sure you follow up with your health care provider if problems continue. HOME CARE INSTRUCTIONS It is not possible to completely avoid allergens, but you can reduce your symptoms by taking steps to limit your exposure to them. It helps to know exactly what you are allergic to so that you can avoid your specific triggers. SEEK MEDICAL CARE IF:  You have a fever.  You develop a cough that does not stop easily (persistent).  You have shortness of breath.  You start wheezing.  Symptoms interfere with normal daily activities.   This information is not intended to replace advice given to you by your health care provider. Make sure you discuss any questions you have with your health  care provider.   Document Released: 11/07/2000 Document Revised: 03/05/2014 Document Reviewed: 10/20/2012 Elsevier Interactive Patient Education [employer].

## 2014-12-09 ENCOUNTER — Telehealth: Payer: Self-pay | Admitting: Emergency Medicine

## 2014-12-09 ENCOUNTER — Telehealth: Payer: Self-pay

## 2014-12-09 ENCOUNTER — Encounter: Payer: Self-pay | Admitting: Internal Medicine

## 2014-12-09 ENCOUNTER — Telehealth: Payer: Self-pay | Admitting: Family Medicine

## 2014-12-09 NOTE — Telephone Encounter (Signed)
-----   Message from Gatha Mayer, MD sent at 12/09/2014  4:04 PM EDT ----- Regarding: needs appt Heme + stool  Referral in and I think appt already  [facility]

## 2014-12-09 NOTE — Telephone Encounter (Signed)
Dr Everlene Farrier wants her seen sooner by GI than December, can you call GI and see if they can move up appt ?

## 2014-12-09 NOTE — Telephone Encounter (Signed)
Pt is wanting to talk with dr Everlene Farrier or Jackelyn Poling gessner about trying to get her gi referral changed to earlier than December

## 2014-12-09 NOTE — Telephone Encounter (Signed)
I called and spoke with the patient. I told her the stools she had check for occult blood in December were positive for blood. The results of these tests were not routed to [facility] for her review. These results should have been sent to her but were not. We are working with  Our  lab to rectify this issue so it does not happen again. I told her I'd spoken with [facility] and she is working on a quick referral to GI for evaluation. I told her I was praying  for good results. I discussed all the possibilities for blood in the stool with her. I told her the only way to get a diagnosis would be to proceed with endoscopy and colonoscopy. She voiced understanding.

## 2014-12-09 NOTE — Telephone Encounter (Signed)
Called patient to assure her that we are working on a sooner appointment with GI before 12/16.

## 2014-12-09 NOTE — Telephone Encounter (Signed)
Patient appt moved to 12/21/14 2:00 with Amy Esterwood PA .  Patient notified of new appt date and that appt that was scheduled with Dr. Henrene Pastor from December has been cancelled.

## 2014-12-10 ENCOUNTER — Telehealth: Payer: Self-pay | Admitting: Emergency Medicine

## 2014-12-10 NOTE — Telephone Encounter (Signed)
I called [facility] and spoke to [employer]. I was told that we need to fax over the referral before they can do anything. I advised that Dr Everlene Farrier would be happy to talk with one of the Drs there if it will help. She once again told me that they need the referral faxed first. I will send this as urgent referral to Referrals and speak to [facility] about it.

## 2014-12-10 NOTE — Telephone Encounter (Signed)
Insurance runs out Dec 31.   Oct 25th GI,  Can't get a colonoscopy until after her insurance.  Wants to switch to [facility] if possible.   Jackelyn Poling and [place]

## 2014-12-10 NOTE — Telephone Encounter (Signed)
I spoke personally to Dawn Casey. She will call [facility] and see if we can get patient a quicker appointment.

## 2014-12-10 NOTE — Telephone Encounter (Signed)
Call [facility] and see if we can get patient in next week for evaluation so she can have her colonoscopy as soon as possible.

## 2014-12-13 NOTE — Telephone Encounter (Signed)
See 12/09/14 message.

## 2014-12-15 NOTE — Telephone Encounter (Signed)
I checked Referral notes about this appt (to f/up) and saw that pt cancelled both appts at [facility]. Spoke to [place] in Referrals who verified that we did not cancel the appts, and she will call [facility] to get update on scheduling appt. Dr Everlene Farrier, Juluis Rainier. Also sending to [facility], [place].

## 2014-12-15 NOTE — Telephone Encounter (Signed)
Hi Dawn Casey. It appears she has an appointment the end of the month at [facility]. I think it would be a good idea to call the patient and be sure she is squared away with that appointment.

## 2014-12-21 ENCOUNTER — Ambulatory Visit: Payer: Self-pay | Admitting: Physician Assistant

## 2014-12-21 ENCOUNTER — Telehealth: Payer: Self-pay | Admitting: *Deleted

## 2014-12-21 NOTE — Telephone Encounter (Signed)
Dawn Casey,  Mrs Name was a confused on her medications.  Reading your note it states she was to stop her HCTZ continuing monitoring her BP.  The patient was told this and understood if any changes need to be made can call back if this is ok she states she doesn't need a phone call.

## 2014-12-21 NOTE — Telephone Encounter (Signed)
That is correct, she could stop her HCTZ and continue to monitor her BP.

## 2014-12-28 ENCOUNTER — Ambulatory Visit (INDEPENDENT_AMBULATORY_CARE_PROVIDER_SITE_OTHER): Payer: BLUE CROSS/BLUE SHIELD | Admitting: Physician Assistant

## 2014-12-28 ENCOUNTER — Ambulatory Visit (INDEPENDENT_AMBULATORY_CARE_PROVIDER_SITE_OTHER): Payer: BLUE CROSS/BLUE SHIELD

## 2014-12-28 ENCOUNTER — Encounter: Payer: Self-pay | Admitting: Physician Assistant

## 2014-12-28 VITALS — BP 110/74 | HR 73 | Temp 98.8°F | Resp 16 | Ht 65.0 in | Wt 132.2 lb

## 2014-12-28 DIAGNOSIS — M25551 Pain in right hip: Secondary | ICD-10-CM

## 2014-12-28 DIAGNOSIS — M1990 Unspecified osteoarthritis, unspecified site: Secondary | ICD-10-CM | POA: Diagnosis not present

## 2014-12-28 DIAGNOSIS — M1611 Unilateral primary osteoarthritis, right hip: Secondary | ICD-10-CM

## 2014-12-28 NOTE — Progress Notes (Signed)
[facility] [address] [place] [address] [phone]  Date:  12/28/2014   Name:  Minha Fulco   DOB:  09-29-1955   MRN:  [id number]  PCP:  No PCP Per Patient    History of Present Illness:  Shadee Rathod is a 59 y.o. female patient who presents to [facility] for right upper leg pain for 2 months.  Patient reports that she was moving and lifting heavy objects, when the next day.  She started to have right inner thigh pain.  She reports that it appears to have worsened over the last 2 months.  She now has pain on the outside of her thigh.  Aggravated with walking.  She will have a sharp pain at her leg.  She says it feels like it is going to pop, however no popping appreciated.  She has no numbness or tingling, or warmth in the area. She has stopped exercising due to the injury.  She reports no lower back pain.     Patient Active Problem List   Diagnosis Date Noted  . Generalized anxiety disorder 01/16/2014  . Anemia 01/16/2014  . Essential hypertension 01/16/2014    Past Medical History  Diagnosis Date  . Allergy   . Anxiety   . Hypertension     Past Surgical History  Procedure Laterality Date  . Appendectomy      Social History  Substance Use Topics  . Smoking status: Never Smoker   . Smokeless tobacco: None  . Alcohol Use: 3.6 oz/week    6 Standard drinks or equivalent per week    Family History  Problem Relation Age of Onset  . Hypertension Mother   . Diabetes Father   . Hypertension Father     Allergies  Allergen Reactions  . Adhesive [Tape]   . Erythromycin   . Prozac [Fluoxetine Hcl] Hives  . Sulfa Antibiotics     Medication list has been reviewed and updated.  Current Outpatient Prescriptions on File Prior to Visit  Medication Sig Dispense Refill  . ALPRAZolam (XANAX) 0.5 MG tablet Take 1 tablet daily as needed for stress and anxiety. Patient can take up to 2 tablets a day if necessary. 90 tablet 1  .  hydrochlorothiazide (HYDRODIURIL) 12.5 MG tablet Take 1 tablet (12.5 mg total) by mouth daily. 30 tablet 0  . lisinopril (PRINIVIL,ZESTRIL) 20 MG tablet Take 1 tablet (20 mg total) by mouth daily. 90 tablet 1  . propranolol (INDERAL) 20 MG tablet Take 1 tablet (20 mg total) by mouth daily. 90 tablet 1   No current facility-administered medications on file prior to visit.    ROS ROS otherwise unremarkable unless listed above.   Physical Examination: BP 110/74 mmHg  Pulse 73  Temp(Src) 98.8 F (37.1 C) (Oral)  Resp 16  Ht 5\' 5"  (1.651 m)  Wt 132 lb 3.2 oz (59.966 kg)  BMI 22.00 kg/m2 Ideal Body Weight: Weight in (lb) to have BMI = 25: 149.9  Physical Exam  Constitutional: She is oriented to person, place, and time. She appears well-developed and well-nourished. No distress.  HENT:  Head: Normocephalic and atraumatic.  Right Ear: External ear normal.  Left Ear: External ear normal.  Eyes: Conjunctivae and EOM are normal. Pupils are equal, round, and reactive to light.  Cardiovascular: Normal rate.   Pulmonary/Chest: Effort normal. No respiratory distress.  Musculoskeletal:       Right hip: She exhibits tenderness (pain with internal rotation). She exhibits normal  range of motion, no bony tenderness, no swelling and no crepitus.  No spinous tenderness.  No tenderness over trochanter.  Negative ITB.  No pain or decreased rom with forward flexion, or torso rotation.   Neurological: She is alert and oriented to person, place, and time.  Skin: She is not diaphoretic.  Psychiatric: She has a normal mood and affect. Her behavior is normal.   [facility] reading (PRIMARY) by  Dr. Elder Cyphers: Degenerative changes with loss of joint space.  Calcifications of the greater trochanter.  No femur fracture.  Assessment and Plan: Sada Mazzoni is a 59 y.o. female who is here today for right hip pain for 2 months.  Appears arthritic.  Advised tylenol and ice and warmth interchange.   At this time,  ortho consult is appreciated at this time.  Possible bursitis, musculature strain.    Right hip pain - Plan: DG HIP UNILAT W OR W/O PELVIS 2-3 VIEWS RIGHT, DG FEMUR, MIN 2 VIEWS RIGHT, Ambulatory referral to Orthopedic Surgery  Arthritis of right hip - Plan: Ambulatory referral to Orthopedic Surgery   Ivar Drape, PA-C [facility] 12/28/2014 1:21 PM

## 2014-12-28 NOTE — Patient Instructions (Addendum)
You can use tylenol for the pain.  Please await referral to the orthopedist at this time.  Arthritis Arthritis is a term that is commonly used to refer to joint pain or joint disease. There are more than 100 types of arthritis. CAUSES The most common cause of this condition is wear and tear of a joint. Other causes include:  Gout.  Inflammation of a joint.  An infection of a joint.  Sprains and other injuries near the joint.  A drug reaction or allergic reaction. In some cases, the cause may not be known. SYMPTOMS The main symptom of this condition is pain in the joint with movement. Other symptoms include:  Redness, swelling, or stiffness at a joint.  Warmth coming from the joint.  Fever.  Overall feeling of illness. DIAGNOSIS This condition may be diagnosed with a physical exam and tests, including:  Blood tests.  Urine tests.  Imaging tests, such as MRI, X-rays, or a CT scan. Sometimes, fluid is removed from a joint for testing. TREATMENT Treatment for this condition may involve:  Treatment of the cause, if it is known.  Rest.  Raising (elevating) the joint.  Applying cold or hot packs to the joint.  Medicines to improve symptoms and reduce inflammation.  Injections of a steroid such as cortisone into the joint to help reduce pain and inflammation. Depending on the cause of your arthritis, you may need to make lifestyle changes to reduce stress on your joint. These changes may include exercising more and losing weight. HOME CARE INSTRUCTIONS Medicines  Take over-the-counter and prescription medicines only as told by your health care provider.  Do not take aspirin to relieve pain if gout is suspected. Activities  Rest your joint if told by your health care provider. Rest is important when your disease is active and your joint feels painful, swollen, or stiff.  Avoid activities that make the pain worse. It is important to balance activity with  rest.  Exercise your joint regularly with range-of-motion exercises as told by your health care provider. Try doing low-impact exercise, such as:  Swimming.  Water aerobics.  Biking.  Walking. Joint Care  If your joint is swollen, keep it elevated if told by your health care provider.  If your joint feels stiff in the morning, try taking a warm shower.  If directed, apply heat to the joint. If you have diabetes, do not apply heat without permission from your health care provider.  Put a towel between the joint and the hot pack or heating pad.  Leave the heat on the area for 20-30 minutes.  If directed, apply ice to the joint:  Put ice in a plastic bag.  Place a towel between your skin and the bag.  Leave the ice on for 20 minutes, 2-3 times per day.  Keep all follow-up visits as told by your health care provider. This is important. SEEK MEDICAL CARE IF:  The pain gets worse.  You have a fever. SEEK IMMEDIATE MEDICAL CARE IF:  You develop severe joint pain, swelling, or redness.  Many joints become painful and swollen.  You develop severe back pain.  You develop severe weakness in your leg.  You cannot control your bladder or bowels.   This information is not intended to replace advice given to you by your health care provider. Make sure you discuss any questions you have with your health care provider.   Document Released: 03/22/2004 Document Revised: 11/03/2014 Document Reviewed: 05/10/2014 Elsevier Interactive Patient Education  2016 [employer].

## 2015-01-24 ENCOUNTER — Telehealth: Payer: Self-pay

## 2015-01-24 NOTE — Telephone Encounter (Signed)
Pt is requesting a refill of ALPRAZolam (XANAX) 0.5 MG tablet 90 day supply. [facility] home delivery

## 2015-01-25 ENCOUNTER — Other Ambulatory Visit: Payer: Self-pay | Admitting: Family Medicine

## 2015-01-25 DIAGNOSIS — F4323 Adjustment disorder with mixed anxiety and depressed mood: Secondary | ICD-10-CM

## 2015-01-25 MED ORDER — ALPRAZOLAM 0.5 MG PO TABS: ORAL_TABLET | ORAL | Status: DC

## 2015-01-25 NOTE — Telephone Encounter (Signed)
Order faxed.

## 2015-01-27 ENCOUNTER — Telehealth: Payer: Self-pay

## 2015-01-27 NOTE — Telephone Encounter (Signed)
[facility] pharm faxed back pt's alprazolam Rx stating that they can not accept controlled Rxs in NPs or PAs name, must be MD. I called the Rx back in and put it under Dr Thompson Caul name.

## 2015-02-07 ENCOUNTER — Ambulatory Visit: Payer: Self-pay | Admitting: Internal Medicine

## 2015-02-08 ENCOUNTER — Telehealth: Payer: Self-pay | Admitting: Family Medicine

## 2015-02-08 NOTE — Telephone Encounter (Signed)
Called patient to check on her. She reports that she had her colonoscopy/egd. She was negative for celiac disease and no findings to explain anemia. She continues to have hip pain, saw ortho who suggested steroid shots and eventual hip replacement. She continues to have intermittent pain, worse with any activity.  She had recent gyn exam and mammo which were normal. She has follow up scheduled.

## 2015-03-08 ENCOUNTER — Telehealth: Payer: Self-pay

## 2015-03-08 NOTE — Telephone Encounter (Signed)
Jonelle Sidle will you be sure this gets forwarded to [facility] thank you

## 2015-03-08 NOTE — Telephone Encounter (Signed)
1) [facility], wants to know if Dr. Everlene Farrier has reviewed endoscopy/colonoscopy results// 2) Based on how referral written, pt states that due to the anemia diagnosis they are coding colonoscopy the services were coded as non-preventive/routine. Pt feels this should have been routine/wants code correction and since no treatment for anemia would like diagnosis removed. Please call to discuss with JD:[phone] Fabian in billing at [place] $1,016. 3) Pt would also like a second opinion as Dr. Paulo Fruit, of [facility] after reviewing xray results has recommended a hip replacement. Do you agree?    [phone] Please CB to advise

## 2015-03-10 NOTE — Telephone Encounter (Signed)
Still waiting on endo/colonoscopy report from [facility], have several calls into them requesting report be sent to [facility]. I spoke with several people in the billing department regarding patient's charges for diagnostic services. All documentation from our office as well as their documentation supports diagnostic coding. According to billing personnel, the patient paid portion of the cost and signed an agreement to pay the rest of her share. The patient admits to signing the forms, but states that she didn't really read them.  Offered patient further workup of anemia through hematology which patient declines, stating she feels fine. I asked her if she had access to previous lab results for comparison and she does not.  She is having an injection in her hip and starting PT with in the next week. She is still unsure if she wants to have hip replacement which was suggested by Dr. Mayer Camel. She wanted to know if she should have a second opinion, and I told her that it is never a bad idea to have a second opinion prior to major surgery.

## 2015-03-11 ENCOUNTER — Telehealth: Payer: Self-pay | Admitting: Family Medicine

## 2015-03-11 NOTE — Telephone Encounter (Signed)
Called [facility] at 10:33 am and spoke to [employer] and she was going to fax over the OV, colonoscopy, and endoscopy reports.

## 2015-03-18 ENCOUNTER — Other Ambulatory Visit: Payer: Self-pay | Admitting: Family Medicine

## 2015-03-18 ENCOUNTER — Telehealth: Payer: Self-pay

## 2015-03-18 NOTE — Telephone Encounter (Signed)
[facility] called. Dawn Casey they spoke to [employer] order and they never shipped pt's Xanax from 11/29. Wanted to know if we could just call it into them. Done

## 2015-04-26 ENCOUNTER — Telehealth: Payer: Self-pay

## 2015-04-26 NOTE — Telephone Encounter (Signed)
Patient is calling to leave a message for Debbie. She wants to talk about her upcoming appointment to see if it's still necessary to come. Also, patient went to [facility] and had her hip looked at. She now has an appointment at [place] on 3/6 and her pelvic x-ray ray sent over by then.   Also, patient needs propranolol, lisinopril but not urgently. Phone: [phone]

## 2015-04-27 NOTE — Telephone Encounter (Signed)
Attempted to contact patient. Left her a message, will try her again tomorrow.

## 2015-04-28 NOTE — Telephone Encounter (Signed)
Left message that I can refill propanolol and lisinopril, but she needs to be seen regularly to check blood work.  Also that [facility] should be able to see her xray report that was done at [facility]. She was instructed to call back if she has further questions.

## 2015-04-30 ENCOUNTER — Telehealth: Payer: Self-pay

## 2015-04-30 NOTE — Telephone Encounter (Signed)
error 

## 2015-04-30 NOTE — Telephone Encounter (Signed)
Pt would like Dawn Casey to know [employer] won't cover her medications to go to [place] any longer. She would like [place] to use [facility] [address]. She won't need a refill for another month. She is going to her orthopedic surgeon's  appointment this coming Monday 05/12/15. She plans to give you an update on what is going to happen with her medical care.

## 2015-05-01 NOTE — Telephone Encounter (Signed)
Please review

## 2015-05-03 ENCOUNTER — Telehealth: Payer: Self-pay

## 2015-05-03 NOTE — Telephone Encounter (Signed)
Pt would like Dawn Casey to know she went to 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 Was given a high dose of Ibuprofen for pain but will not be during surgery Will be coming back to see Dawn Casey for blood work so nothing will affect her kidneys. You may call her at [phone] if needed

## 2015-05-24 ENCOUNTER — Ambulatory Visit: Payer: BLUE CROSS/BLUE SHIELD | Admitting: Family Medicine

## 2015-06-06 ENCOUNTER — Ambulatory Visit: Payer: BLUE CROSS/BLUE SHIELD | Admitting: Family Medicine

## 2015-06-14 ENCOUNTER — Other Ambulatory Visit: Payer: Self-pay | Admitting: Family Medicine

## 2015-06-24 ENCOUNTER — Telehealth: Payer: Self-pay

## 2015-06-24 ENCOUNTER — Ambulatory Visit (INDEPENDENT_AMBULATORY_CARE_PROVIDER_SITE_OTHER): Payer: BLUE CROSS/BLUE SHIELD | Admitting: Physician Assistant

## 2015-06-24 VITALS — BP 128/80 | HR 77 | Temp 99.0°F | Resp 17 | Ht 64.5 in | Wt 135.0 lb

## 2015-06-24 DIAGNOSIS — H6123 Impacted cerumen, bilateral: Secondary | ICD-10-CM

## 2015-06-24 DIAGNOSIS — J309 Allergic rhinitis, unspecified: Secondary | ICD-10-CM

## 2015-06-24 MED ORDER — IPRATROPIUM BROMIDE 0.03 % NA SOLN: 2.0000 | Freq: Two times a day (BID) | NASAL | Status: DC

## 2015-06-24 NOTE — Patient Instructions (Addendum)
Use atrovent nasal spray two sprays in each nostril twice a day. Take zyrtec nightly. Avoid putting q-tips in your ear. May use mineral oil, hydrogen peroxide or debrox once or twice a month to help break up the wax in your ears.    IF you received an x-ray today, you will receive an invoice from [facility]. Please contact [facility] at [phone] with questions or concerns regarding your invoice.   IF you received labwork today, you will receive an invoice from [employer]. Please contact [facility] at [phone] with questions or concerns regarding your invoice.   Our billing staff will not be able to assist you with questions regarding bills from these companies.  You will be contacted with the lab results as soon as they are available. The fastest way to get your results is to activate your My Chart account. Instructions are located on the last page of this paperwork. If you have not heard from [place] regarding the results in 2 weeks, please contact this office.

## 2015-06-24 NOTE — Progress Notes (Signed)
[facility] [address], [place] [address] [phone]  Date:  06/24/2015   Name:  Dawn Casey   DOB:  [birth year]   MRN:  [id number]  PCP:  No PCP Per Patient    Chief Complaint: Sinusitis and Hearing Problem   History of Present Illness:  This is a 60 y.o. female with PMH GAD, anemia, HTN who is presenting with sinus issue for the past 2-3 weeks. States her face feels and looks puffy to her. No sinus pressure. Has a lot of nasal drainage. Can't hear out of both ears. No tinnitus or otalgia. Denies cough, sore throat, fever, chills.  Aggravating/alleviating factors: tried benadryl, saline nasal spray, hydrogen peroxide in ears and nothing has helped. History of asthma: no History of env allergies: yes, springtime and mold Tobacco use: no  Review of Systems:  Review of Systems See HPI  Patient Active Problem List   Diagnosis Date Noted  . Generalized anxiety disorder 01/16/2014  . Anemia 01/16/2014  . Essential hypertension 01/16/2014    Prior to Admission medications   Medication Sig Start Date End Date Taking? Authorizing Provider  ALPRAZolam Duanne Moron) 0.5 MG tablet Take 1 tablet daily as needed for stress and anxiety. Patient can take up to 2 tablets a day if necessary. 01/25/15  Yes Elby Beck, FNP  IRON PO Take by mouth daily.   Yes Historical Provider, MD  lisinopril (PRINIVIL,ZESTRIL) 20 MG tablet TAKE 1 TABLET BY MOUTH EVERY DAY 06/14/15  Yes Elby Beck, FNP  Multiple Vitamin (MULTI VITAMIN PO) Take by mouth daily.   Yes Historical Provider, MD  propranolol (INDERAL) 20 MG tablet TAKE 1 TABLET BY MOUTH EVERY DAY 06/14/15  Yes Elby Beck, FNP  hydrochlorothiazide (HYDRODIURIL) 12.5 MG tablet Take 1 tablet (12.5 mg total) by mouth daily. Patient not taking: Reported on 06/24/2015 12/06/14   Elby Beck, FNP    Allergies  Allergen Reactions  . Adhesive [Tape]   . Erythromycin   . Prozac [Fluoxetine Hcl] Hives   . Sulfa Antibiotics     Past Surgical History  Procedure Laterality Date  . Appendectomy      Social History  Substance Use Topics  . Smoking status: Never Smoker   . Smokeless tobacco: None  . Alcohol Use: 3.6 oz/week    6 Standard drinks or equivalent per week    Family History  Problem Relation Age of Onset  . Hypertension Mother   . Diabetes Father   . Hypertension Father     Medication list has been reviewed and updated.  Physical Examination:  Physical Exam  Constitutional: She is oriented to person, place, and time. She appears well-developed and well-nourished. No distress.  HENT:  Head: Normocephalic and atraumatic.  Right Ear: Hearing, external ear and ear canal normal.  Left Ear: Hearing, external ear and ear canal normal.  Nose: Right sinus exhibits maxillary sinus tenderness (mild). Right sinus exhibits no frontal sinus tenderness. Left sinus exhibits no maxillary sinus tenderness and no frontal sinus tenderness.  Mouth/Throat: Uvula is midline, oropharynx is clear and moist and mucous membranes are normal.  Bilateral TMs obstructed by cerumen After lavage canals and TMs clear, symptoms resolved.  Eyes: Conjunctivae and lids are normal. Right eye exhibits no discharge. Left eye exhibits no discharge. No scleral icterus.  Cardiovascular: Normal rate, regular rhythm, normal heart sounds and normal pulses.   No murmur heard. Pulmonary/Chest: Effort normal and breath sounds normal. No respiratory distress. She  has no wheezes. She has no rhonchi. She has no rales.  Musculoskeletal: Normal range of motion.  Lymphadenopathy:       Head (right side): No submental, no submandibular and no tonsillar adenopathy present.       Head (left side): No submental, no submandibular and no tonsillar adenopathy present.    She has no cervical adenopathy.  Neurological: She is alert and oriented to person, place, and time.  Skin: Skin is warm, dry and intact. No lesion and no  rash noted.  Psychiatric: She has a normal mood and affect. Her speech is normal and behavior is normal. Thought content normal.   BP 128/80 mmHg  Pulse 77  Temp(Src) 99 F (37.2 C) (Oral)  Resp 17  Ht 5' 4.5" (1.638 m)  Wt 135 lb (61.236 kg)  BMI 22.82 kg/m2  Assessment and Plan:  1. Cerumen impaction, bilateral Cerumen removed by lavage. Symptoms improved.  2. Allergic rhinitis, unspecified allergic rhinitis type Nasal drainage and mild sinus pressure likely d/t allergies. Advised she start taking zyrtec daily and atrovent nasal spray to get her through allergy season. - ipratropium (ATROVENT) 0.03 % nasal spray; Place 2 sprays into both nostrils 2 (two) times daily.  Dispense: 30 mL; Refill: 0   Dawn Casey V. Drenda Freeze, MHS [facility]  06/24/2015

## 2015-06-24 NOTE — Telephone Encounter (Signed)
PATIENT WOULD LIKE DEBBIE GESSNER TO KNOW THAT SHE WAS IN TO SEE NICOLE BUSH TODAY FOR EAR WAX. SHE ALSO WANTS DEBBIE TO KNOW THAT HER ORTHOPEDIC SURGEON PUT HER ON 2400 MG OF IBUPROFEN. SHE SAID THAT IS THE ONLY CHANGE IN HER MEDICATIONS. BEST PHONE [phone] (CELL) [place]

## 2015-06-24 NOTE — Telephone Encounter (Signed)
Dawn Casey,  Pt wanted you to be aware that she came in today and saw Elmyra Ricks for ear wax. Also her orthopedic surgeon placed on her on 2400mg  ibuprofen.    Lynnea Vandervoort

## 2015-08-12 DIAGNOSIS — D2262 Melanocytic nevi of left upper limb, including shoulder: Secondary | ICD-10-CM | POA: Diagnosis not present

## 2015-08-12 DIAGNOSIS — C44319 Basal cell carcinoma of skin of other parts of face: Secondary | ICD-10-CM | POA: Diagnosis not present

## 2015-08-12 DIAGNOSIS — D225 Melanocytic nevi of trunk: Secondary | ICD-10-CM | POA: Diagnosis not present

## 2015-08-12 DIAGNOSIS — C4490 Unspecified malignant neoplasm of skin, unspecified: Secondary | ICD-10-CM

## 2015-08-12 DIAGNOSIS — D2261 Melanocytic nevi of right upper limb, including shoulder: Secondary | ICD-10-CM | POA: Diagnosis not present

## 2015-08-12 DIAGNOSIS — L821 Other seborrheic keratosis: Secondary | ICD-10-CM | POA: Diagnosis not present

## 2015-08-12 HISTORY — DX: Unspecified malignant neoplasm of skin, unspecified: C44.90

## 2015-08-17 DIAGNOSIS — C44319 Basal cell carcinoma of skin of other parts of face: Secondary | ICD-10-CM | POA: Diagnosis not present

## 2015-08-17 HISTORY — PX: BASAL CELL CARCINOMA EXCISION: SHX1214

## 2015-08-23 ENCOUNTER — Other Ambulatory Visit: Payer: Self-pay | Admitting: Family Medicine

## 2015-08-24 ENCOUNTER — Telehealth: Payer: Self-pay

## 2015-08-24 NOTE — Telephone Encounter (Signed)
Do you want to refill or RTC for office visit.

## 2015-08-24 NOTE — Telephone Encounter (Addendum)
Pt is wanting to know why she has been denied her refill of her alprazalam  Best number [phone]

## 2015-08-29 NOTE — Telephone Encounter (Signed)
Spoke with pt, she states she needs her refill on her alprazolam because her prescription states she can take two a day but she has run out. She states she was upset that Jackelyn Poling did not call her back. She has a lot of health problems and needs to see someone. Dr. Everlene Farrier is on her list as her primary care physician. I advised her to come in to see Dr. Everlene Farrier but can she get a Rx for her Xanax. She has had cancer and she has to get a hip replacement. Please advise. I can schedule appointment when you respond.

## 2015-08-30 ENCOUNTER — Other Ambulatory Visit: Payer: Self-pay | Admitting: Emergency Medicine

## 2015-08-30 DIAGNOSIS — F4323 Adjustment disorder with mixed anxiety and depressed mood: Secondary | ICD-10-CM

## 2015-08-30 MED ORDER — ALPRAZOLAM 0.5 MG PO TABS: ORAL_TABLET | ORAL | Status: DC

## 2015-08-30 NOTE — Telephone Encounter (Signed)
I did call in a prescription for her for 90 days. I have actually not seen her since we have been on Epic. She was cared for by Tor Netters. She needs to come in to urgent care and be seen. It would be better to see one of the other providers that will be staying.

## 2015-08-31 NOTE — Telephone Encounter (Signed)
Called and LMOM that I have faxed in a RF and she should RTC to est care with a new provider before this RF runs out.

## 2015-08-31 NOTE — Telephone Encounter (Signed)
Called pt, busy signal.

## 2015-09-01 ENCOUNTER — Other Ambulatory Visit: Payer: Self-pay | Admitting: Family Medicine

## 2015-09-02 ENCOUNTER — Other Ambulatory Visit: Payer: Self-pay | Admitting: Emergency Medicine

## 2015-09-09 ENCOUNTER — Ambulatory Visit (INDEPENDENT_AMBULATORY_CARE_PROVIDER_SITE_OTHER): Payer: BLUE CROSS/BLUE SHIELD | Admitting: Family Medicine

## 2015-09-09 VITALS — BP 112/68 | HR 67 | Temp 98.6°F | Resp 18 | Ht 64.5 in | Wt 137.0 lb

## 2015-09-09 DIAGNOSIS — F411 Generalized anxiety disorder: Secondary | ICD-10-CM | POA: Diagnosis not present

## 2015-09-09 DIAGNOSIS — I10 Essential (primary) hypertension: Secondary | ICD-10-CM | POA: Diagnosis not present

## 2015-09-09 DIAGNOSIS — D649 Anemia, unspecified: Secondary | ICD-10-CM

## 2015-09-09 DIAGNOSIS — E871 Hypo-osmolality and hyponatremia: Secondary | ICD-10-CM

## 2015-09-09 DIAGNOSIS — M25551 Pain in right hip: Secondary | ICD-10-CM | POA: Diagnosis not present

## 2015-09-09 DIAGNOSIS — Z85828 Personal history of other malignant neoplasm of skin: Secondary | ICD-10-CM | POA: Diagnosis not present

## 2015-09-09 DIAGNOSIS — Z13220 Encounter for screening for lipoid disorders: Secondary | ICD-10-CM | POA: Diagnosis not present

## 2015-09-09 MED ORDER — LISINOPRIL 20 MG PO TABS: 20.0000 mg | ORAL_TABLET | Freq: Every day | ORAL | Status: DC

## 2015-09-09 MED ORDER — PROPRANOLOL HCL 20 MG PO TABS: 20.0000 mg | ORAL_TABLET | Freq: Every day | ORAL | Status: DC

## 2015-09-09 NOTE — Patient Instructions (Addendum)
IF you received an x-ray today, you will receive an invoice from [facility]. Please contact [facility] at [phone] with questions or concerns regarding your invoice.   IF you received labwork today, you will receive an invoice from [employer]. Please contact [facility] at [phone] with questions or concerns regarding your invoice.   Our billing staff will not be able to assist you with questions regarding bills from these companies.  You will be contacted with the lab results as soon as they are available. The fastest way to get your results is to activate your My Chart account. Instructions are located on the last page of this paperwork. If you have not heard from [place] regarding the results in 2 weeks, please contact this office.    Return within the next 1 week to have fasting labs drawn. This should be fasting for 8 hours.   I will check your blood counts, but if still low, may be beneficial to meet with hematologist. I can refer you if needed.   Ok to continue 2 current meds for blood pressures. I will also check cholesterol and other electrolytes.   No change in medications at this time, but schedule appointment with your previous therapist to discuss anxiety further. I would consider a daily medication for anxiety if you persistently need the Xanax more than a few times per week. We can discuss this at next visit. See other information below.   Return to the clinic or go to the nearest emergency room if any of your symptoms worsen or new symptoms occur.  Generalized Anxiety Disorder Generalized anxiety disorder (GAD) is a mental disorder. It interferes with life functions, including relationships, work, and school. GAD is different from normal anxiety, which everyone experiences at some point in their lives in response to specific life events and activities. Normal anxiety actually helps [place] prepare for and get through these life  events and activities. Normal anxiety goes away after the event or activity is over.  GAD causes anxiety that is not necessarily related to specific events or activities. It also causes excess anxiety in proportion to specific events or activities. The anxiety associated with GAD is also difficult to control. GAD can vary from mild to severe. People with severe GAD can have intense waves of anxiety with physical symptoms (panic attacks).  SYMPTOMS The anxiety and worry associated with GAD are difficult to control. This anxiety and worry are related to many life events and activities and also occur more days than not for 6 months or longer. People with GAD also have three or more of the following symptoms (one or more in children):  Restlessness.   Fatigue.  Difficulty concentrating.   Irritability.  Muscle tension.  Difficulty sleeping or unsatisfying sleep. DIAGNOSIS GAD is diagnosed through an assessment by your health care provider. Your health care provider will ask you questions aboutyour mood,physical symptoms, and events in your life. Your health care provider may ask you about your medical history and use of alcohol or drugs, including prescription medicines. Your health care provider may also do a physical exam and blood tests. Certain medical conditions and the use of certain substances can cause symptoms similar to those associated with GAD. Your health care provider may refer you to a mental health specialist for further evaluation. TREATMENT The following therapies are usually used to treat GAD:   Medication. Antidepressant medication usually is prescribed for long-term daily control. Antianxiety medicines may be added in  severe cases, especially when panic attacks occur.   Talk therapy (psychotherapy). Certain types of talk therapy can be helpful in treating GAD by providing support, education, and guidance. A form of talk therapy called cognitive behavioral therapy can  teach you healthy ways to think about and react to daily life events and activities.  Stress managementtechniques. These include yoga, meditation, and exercise and can be very helpful when they are practiced regularly. A mental health specialist can help determine which treatment is best for you. Some people see improvement with one therapy. However, other people require a combination of therapies.   This information is not intended to replace advice given to you by your health care provider. Make sure you discuss any questions you have with your health care provider.   Document Released: 06/09/2012 Document Revised: 03/05/2014 Document Reviewed: 06/09/2012 Elsevier Interactive Patient Education [employer].

## 2015-09-09 NOTE — Progress Notes (Signed)
Subjective:  By signing my name below, I, [name], attest that this documentation has been prepared under the direction and in the presence of [name], MD.  Electronically Signed: [name], ED Scribe. 09/09/2015. 1:47 PM.   Patient ID: Dawn Casey, female    DOB: [birth year], 60 y.o.   MRN: [id number]  HPI Chief Complaint  Patient presents with  . transfer care to Dr. Carlota Raspberry    HPI Comments: Dawn Casey is a 60 y.o. female who presents to the [facility] establish care. Previous pt of Dr. Everlene Farrier then [facility]. Last routine appointment 11/2014. She's had a hx of anxiety, HTN, anemia.   Hypertension She is on Lisinopril 20 mg qd and propranolol 20mg  qd.  Lab Results  Component Value Date   CREATININE 0.70 12/06/2014   Anxiety Hx of general anxiety disorder. Has used xanax 0.5 mg up to twice per day. Last filled 7/4 for # 27. This was discussed in December when she was having increased anxiety after break up with fiance. She was taking xanax at that time most day and apparently in therapy.   Pt takes 1-2 xanax everyday and a about 6-8 a week. She reports Hives with Prozac in the past. She's tried other SSRI's including Zoloft and Paxil in the past but states this caused a decrease in libido. She also found Wellbutrin ineffective. She has not spoke with a Therapist since last summer due to cost and hip pain  Anemia  See 11/2014 visit. She does have a hx of Hem + stool, but decline seeing GI.  Lab Results  Component Value Date   WBC 5.4 12/06/2014   HGB 11.2* 12/06/2014   HCT 32.3* 12/06/2014   MCV 95.8 12/06/2014   PLT 358 12/06/2014   Appears she was referred to GI at that visit. She had an elevated ferritin of 342 in 12/2013. Normal B12 and normal iron studies. Pt takes OTC iron supplement. Pt states she was seen by GI. She had colonoscopy and endoscopy which were both normal. She denies dark or tarry stools or blood in stools.    Hyponitremia    Sodium 134 11/2014  Hip pain Pt is filing for social security and disability. She states she was told she needed a hip replacement by 2 different orthopedist. She has been on Duexis for hip pain, prescribed by Dr. Delfino Lovett at [facility].  She's also seen Dr. Dalbert Mayotte previously and received a hip injection. Her current plan of treatment is to continue on Duexis.    Skin Cancer  Pt also reports skin cancer 2 weeks to right forehead. States she had mohs surgery by Dr. Sarajane Jews.    Pt last ate 2 hours ago.   Patient Active Problem List   Diagnosis Date Noted  . Generalized anxiety disorder 01/16/2014  . Anemia 01/16/2014  . Essential hypertension 01/16/2014   Past Medical History  Diagnosis Date  . Allergy   . Anxiety   . Hypertension    Past Surgical History  Procedure Laterality Date  . Appendectomy     Allergies  Allergen Reactions  . Adhesive [Tape]   . Erythromycin   . Prozac [Fluoxetine Hcl] Hives  . Sulfa Antibiotics    Prior to Admission medications   Medication Sig Start Date End Date Taking? Authorizing Provider  ALPRAZolam Duanne Moron) 0.5 MG tablet Take 1 tablet daily as needed for stress and anxiety. Patient can take up to 2 tablets a day if necessary.  08/30/15  Yes Darlyne Russian, MD  Ibuprofen-Famotidine (DUEXIS) 800-26.6 MG TABS Take by mouth.   Yes Historical Provider, MD  IRON PO Take by mouth daily.   Yes Historical Provider, MD  lisinopril (PRINIVIL,ZESTRIL) 20 MG tablet TAKE 1 TABLET BY MOUTH EVERY DAY 09/02/15  Yes Darlyne Russian, MD  Multiple Vitamin (MULTI VITAMIN PO) Take by mouth daily.   Yes Historical Provider, MD  propranolol (INDERAL) 20 MG tablet TAKE 1 TABLET BY MOUTH EVERY DAY 09/05/15  Yes Jaynee Eagles, PA-C  hydrochlorothiazide (HYDRODIURIL) 12.5 MG tablet Take 1 tablet (12.5 mg total) by mouth daily. Patient not taking: Reported on 09/09/2015 12/06/14   Elby Beck, FNP  ipratropium (ATROVENT) 0.03 % nasal spray Place 2 sprays  into both nostrils 2 (two) times daily. Patient not taking: Reported on 09/09/2015 06/24/15   Ezekiel Slocumb, PA-C   Social History   Social History  . Marital Status: Divorced    Spouse Name: N/A  . Number of Children: N/A  . Years of Education: N/A   Occupational History  . Not on file.   Social History Main Topics  . Smoking status: Never Smoker   . Smokeless tobacco: Not on file  . Alcohol Use: 3.6 oz/week    6 Standard drinks or equivalent per week  . Drug Use: No  . Sexual Activity: Not on file   Other Topics Concern  . Not on file   Social History Narrative   Review of Systems  Objective:   Physical Exam  Constitutional: She is oriented to person, place, and time. She appears well-developed and well-nourished.  HENT:  Head: Normocephalic and atraumatic.  Eyes: Conjunctivae and EOM are normal. Pupils are equal, round, and reactive to light.  Neck: Carotid bruit is not present.  Cardiovascular: Normal rate, regular rhythm, normal heart sounds and intact distal pulses.   Pulmonary/Chest: Effort normal and breath sounds normal.  Abdominal: Soft. She exhibits no pulsatile midline mass. There is no tenderness.  Neurological: She is alert and oriented to person, place, and time.  Skin: Skin is warm and dry.  Psychiatric: She has a normal mood and affect. Her behavior is normal.  Vitals reviewed.   Filed Vitals:   09/09/15 1344  BP: 112/68  Pulse: 67  Temp: 98.6 F (37 C)  TempSrc: Oral  Resp: 18  Height: 5' 4.5" (1.638 m)  Weight: 137 lb (62.143 kg)  SpO2: 100%     Assessment & Plan:   Dawn Casey is a 60 y.o. female Essential hypertension - Plan: lisinopril (PRINIVIL,ZESTRIL) 20 MG tablet, propranolol (INDERAL) 20 MG tablet  -Stable. No medication changes today. Will return for fasting labs within the next 1 week. 6 months medications given.  Right hip pain  - Continue follow-up with orthopedist.  Screening for hyperlipidemia - Plan: COMPLETE  METABOLIC PANEL WITH GFR, Lipid panel  Hyponatremia - Plan: COMPLETE METABOLIC PANEL WITH GFR  -Borderline when last checked. CMP ordered.  Generalized anxiety disorder  - Generalized anxiety with situational anxiety/stressors and possible social anxiety component with flushing as described above. Recommended follow-up with therapist for CBT, has unable to exercise currently with hip pain. Continue Xanax at current dosing, but if persistent or frequent knee, could try another SSRI (although did not tolerate due to libido issues in the past, different agent may be tolerated better)  History of basal cell cancer  -status post Mohs surgery. Continue follow-up with dermatology as planned.  Anemia, unspecified anemia type - Plan:  CBC with Differential/Platelet  - Was not iron deficient in the past, reportedly endoscopy and colonoscopy were okay. Repeat CBC, and if still low, consider hematology evaluation.  Meds ordered this encounter  Medications  . Ibuprofen-Famotidine (DUEXIS) 800-26.6 MG TABS    Sig: Take by mouth.  Marland Kitchen lisinopril (PRINIVIL,ZESTRIL) 20 MG tablet    Sig: Take 1 tablet (20 mg total) by mouth daily.    Dispense:  90 tablet    Refill:  1  . propranolol (INDERAL) 20 MG tablet    Sig: Take 1 tablet (20 mg total) by mouth daily.    Dispense:  90 tablet    Refill:  1   Patient Instructions       IF you received an x-ray today, you will receive an invoice from [facility]. Please contact [facility] at [phone] with questions or concerns regarding your invoice.   IF you received labwork today, you will receive an invoice from [employer]. Please contact [facility] at [phone] with questions or concerns regarding your invoice.   Our billing staff will not be able to assist you with questions regarding bills from these companies.  You will be contacted with the lab results as soon as they are available. The fastest way to get  your results is to activate your My Chart account. Instructions are located on the last page of this paperwork. If you have not heard from [place] regarding the results in 2 weeks, please contact this office.    Return within the next 1 week to have fasting labs drawn. This should be fasting for 8 hours.   I will check your blood counts, but if still low, may be beneficial to meet with hematologist. I can refer you if needed.   Ok to continue 2 current meds for blood pressures. I will also check cholesterol and other electrolytes.   No change in medications at this time, but schedule appointment with your previous therapist to discuss anxiety further. I would consider a daily medication for anxiety if you persistently need the Xanax more than a few times per week. We can discuss this at next visit. See other information below.   Return to the clinic or go to the nearest emergency room if any of your symptoms worsen or new symptoms occur.  Generalized Anxiety Disorder Generalized anxiety disorder (GAD) is a mental disorder. It interferes with life functions, including relationships, work, and school. GAD is different from normal anxiety, which everyone experiences at some point in their lives in response to specific life events and activities. Normal anxiety actually helps [place] prepare for and get through these life events and activities. Normal anxiety goes away after the event or activity is over.  GAD causes anxiety that is not necessarily related to specific events or activities. It also causes excess anxiety in proportion to specific events or activities. The anxiety associated with GAD is also difficult to control. GAD can vary from mild to severe. People with severe GAD can have intense waves of anxiety with physical symptoms (panic attacks).  SYMPTOMS The anxiety and worry associated with GAD are difficult to control. This anxiety and worry are related to many life events and activities and also  occur more days than not for 6 months or longer. People with GAD also have three or more of the following symptoms (one or more in children):  Restlessness.   Fatigue.  Difficulty concentrating.   Irritability.  Muscle tension.  Difficulty sleeping or unsatisfying sleep.  DIAGNOSIS GAD is diagnosed through an assessment by your health care provider. Your health care provider will ask you questions aboutyour mood,physical symptoms, and events in your life. Your health care provider may ask you about your medical history and use of alcohol or drugs, including prescription medicines. Your health care provider may also do a physical exam and blood tests. Certain medical conditions and the use of certain substances can cause symptoms similar to those associated with GAD. Your health care provider may refer you to a mental health specialist for further evaluation. TREATMENT The following therapies are usually used to treat GAD:   Medication. Antidepressant medication usually is prescribed for long-term daily control. Antianxiety medicines may be added in severe cases, especially when panic attacks occur.   Talk therapy (psychotherapy). Certain types of talk therapy can be helpful in treating GAD by providing support, education, and guidance. A form of talk therapy called cognitive behavioral therapy can teach you healthy ways to think about and react to daily life events and activities.  Stress managementtechniques. These include yoga, meditation, and exercise and can be very helpful when they are practiced regularly. A mental health specialist can help determine which treatment is best for you. Some people see improvement with one therapy. However, other people require a combination of therapies.   This information is not intended to replace advice given to you by your health care provider. Make sure you discuss any questions you have with your health care provider.   Document Released:  06/09/2012 Document Revised: 03/05/2014 Document Reviewed: 06/09/2012 Elsevier Interactive Patient Education [employer].     I personally performed the services described in this documentation, which was scribed in my presence. The recorded information has been reviewed and considered, and addended by me as needed.   Signed,   [name], MD [facility].  09/09/2015 2:22 PM

## 2015-09-12 ENCOUNTER — Ambulatory Visit (INDEPENDENT_AMBULATORY_CARE_PROVIDER_SITE_OTHER): Payer: BLUE CROSS/BLUE SHIELD | Admitting: Family Medicine

## 2015-09-12 DIAGNOSIS — Z13220 Encounter for screening for lipoid disorders: Secondary | ICD-10-CM | POA: Diagnosis not present

## 2015-09-12 DIAGNOSIS — D649 Anemia, unspecified: Secondary | ICD-10-CM | POA: Diagnosis not present

## 2015-09-12 DIAGNOSIS — E871 Hypo-osmolality and hyponatremia: Secondary | ICD-10-CM | POA: Diagnosis not present

## 2015-09-13 LAB — COMPLETE METABOLIC PANEL WITH GFR
ALK PHOS: 63 U/L (ref 33–130)
ALT: 21 U/L (ref 6–29)
AST: 30 U/L (ref 10–35)
Albumin: 4.1 g/dL (ref 3.6–5.1)
BUN: 19 mg/dL (ref 7–25)
CALCIUM: 9.7 mg/dL (ref 8.6–10.4)
CO2: 23 mmol/L (ref 20–31)
CREATININE: 0.87 mg/dL (ref 0.50–1.05)
Chloride: 102 mmol/L (ref 98–110)
GFR, EST AFRICAN AMERICAN: 84 mL/min (ref >=60)
GFR, Est Non African American: 73 mL/min (ref >=60)
Glucose, Bld: 88 mg/dL (ref 65–99)
POTASSIUM: 4 mmol/L (ref 3.5–5.3)
Sodium: 141 mmol/L (ref 135–146)
Total Bilirubin: 0.6 mg/dL (ref 0.2–1.2)
Total Protein: 6.7 g/dL (ref 6.1–8.1)

## 2015-09-13 LAB — CBC WITH DIFFERENTIAL/PLATELET
BASOS ABS: 44 {cells}/uL (ref 0–200)
Basophils Relative: 1 %
EOS ABS: 176 {cells}/uL (ref 15–500)
Eosinophils Relative: 4 %
HEMATOCRIT: 30.9 % — AB (ref 35.0–45.0)
HEMOGLOBIN: 10.4 g/dL — AB (ref 11.7–15.5)
LYMPHS ABS: 1804 {cells}/uL (ref 850–3900)
LYMPHS PCT: 41 %
MCH: 32.9 pg (ref 27.0–33.0)
MCHC: 33.7 g/dL (ref 32.0–36.0)
MCV: 97.8 fL (ref 80.0–100.0)
MONO ABS: 440 {cells}/uL (ref 200–950)
MPV: 9.4 fL (ref 7.5–12.5)
Monocytes Relative: 10 %
NEUTROS PCT: 44 %
Neutro Abs: 1936 cells/uL (ref 1500–7800)
Platelets: 278 10*3/uL (ref 140–400)
RBC: 3.16 MIL/uL — AB (ref 3.80–5.10)
RDW: 13 % (ref 11.0–15.0)
WBC: 4.4 10*3/uL (ref 3.8–10.8)

## 2015-09-13 LAB — LIPID PANEL
CHOLESTEROL: 213 mg/dL — AB (ref 125–200)
HDL: 73 mg/dL (ref >=46)
LDL Cholesterol: 113 mg/dL (ref <130)
Total CHOL/HDL Ratio: 2.9 Ratio (ref <=5.0)
Triglycerides: 134 mg/dL (ref <150)
VLDL: 27 mg/dL (ref <30)

## 2015-09-17 ENCOUNTER — Encounter: Payer: Self-pay | Admitting: Physician Assistant

## 2015-09-19 ENCOUNTER — Telehealth: Payer: Self-pay

## 2015-09-19 NOTE — Telephone Encounter (Signed)
Patient states documentation of skin cancer and hip replacement are not in her chart.   She is filing for SSN Disability and needs documentation.   She considers [place] to be her PCP.     [phone] (H)

## 2015-09-20 ENCOUNTER — Encounter: Payer: Self-pay | Admitting: Physician Assistant

## 2015-09-20 NOTE — Telephone Encounter (Signed)
Medication history updated. Philis Fendt, MS, PA-C 2:08 PM, 09/20/2015

## 2015-09-20 NOTE — Telephone Encounter (Signed)
How do we go about this?

## 2015-09-21 NOTE — Telephone Encounter (Signed)
Pt would like a response to this e-mail. Please advise at [phone] (H)

## 2015-10-18 DIAGNOSIS — Z0271 Encounter for disability determination: Secondary | ICD-10-CM

## 2015-11-07 ENCOUNTER — Other Ambulatory Visit: Payer: Self-pay | Admitting: Physician Assistant

## 2015-11-12 ENCOUNTER — Other Ambulatory Visit: Payer: Self-pay | Admitting: Emergency Medicine

## 2015-11-12 DIAGNOSIS — F4323 Adjustment disorder with mixed anxiety and depressed mood: Secondary | ICD-10-CM

## 2015-11-15 NOTE — Telephone Encounter (Signed)
Please forward to Dr. Carlota Raspberry who last saw the patient.

## 2015-11-16 NOTE — Telephone Encounter (Signed)
Refilled, but needs follow-up prior to this running out to discuss plan for management of anxiety further.

## 2015-11-17 ENCOUNTER — Other Ambulatory Visit: Payer: Self-pay

## 2015-11-17 DIAGNOSIS — F4323 Adjustment disorder with mixed anxiety and depressed mood: Secondary | ICD-10-CM

## 2015-11-17 MED ORDER — ALPRAZOLAM 0.5 MG PO TABS: ORAL_TABLET | ORAL | 0 refills | Status: DC

## 2015-11-17 NOTE — Telephone Encounter (Signed)
Faxed and notified pt of need for f/up on VM

## 2016-01-03 DIAGNOSIS — M1611 Unilateral primary osteoarthritis, right hip: Secondary | ICD-10-CM | POA: Diagnosis not present

## 2016-01-24 ENCOUNTER — Ambulatory Visit (INDEPENDENT_AMBULATORY_CARE_PROVIDER_SITE_OTHER): Payer: BLUE CROSS/BLUE SHIELD | Admitting: Family Medicine

## 2016-01-24 VITALS — BP 122/92 | HR 60 | Temp 98.5°F | Resp 17 | Ht 64.5 in | Wt 137.0 lb

## 2016-01-24 DIAGNOSIS — Z5181 Encounter for therapeutic drug level monitoring: Secondary | ICD-10-CM | POA: Diagnosis not present

## 2016-01-24 DIAGNOSIS — M1611 Unilateral primary osteoarthritis, right hip: Secondary | ICD-10-CM

## 2016-01-24 DIAGNOSIS — D649 Anemia, unspecified: Secondary | ICD-10-CM

## 2016-01-24 DIAGNOSIS — E785 Hyperlipidemia, unspecified: Secondary | ICD-10-CM | POA: Diagnosis not present

## 2016-01-24 DIAGNOSIS — M25551 Pain in right hip: Secondary | ICD-10-CM | POA: Diagnosis not present

## 2016-01-24 LAB — CBC WITH DIFFERENTIAL/PLATELET
Basophils Absolute: 59 cells/uL (ref 0–200)
Basophils Relative: 1 %
EOS ABS: 295 {cells}/uL (ref 15–500)
Eosinophils Relative: 5 %
HEMATOCRIT: 31.1 % — AB (ref 35.0–45.0)
Hemoglobin: 10.4 g/dL — ABNORMAL LOW (ref 11.7–15.5)
LYMPHS PCT: 30 %
Lymphs Abs: 1770 cells/uL (ref 850–3900)
MCH: 32.7 pg (ref 27.0–33.0)
MCHC: 33.4 g/dL (ref 32.0–36.0)
MCV: 97.8 fL (ref 80.0–100.0)
MONO ABS: 472 {cells}/uL (ref 200–950)
MPV: 9.1 fL (ref 7.5–12.5)
Monocytes Relative: 8 %
NEUTROS PCT: 56 %
Neutro Abs: 3304 cells/uL (ref 1500–7800)
Platelets: 242 10*3/uL (ref 140–400)
RBC: 3.18 MIL/uL — ABNORMAL LOW (ref 3.80–5.10)
RDW: 12.8 % (ref 11.0–15.0)
WBC: 5.9 10*3/uL (ref 3.8–10.8)

## 2016-01-24 LAB — COMPLETE METABOLIC PANEL WITH GFR
ALT: 17 U/L (ref 6–29)
AST: 23 U/L (ref 10–35)
Albumin: 4.4 g/dL (ref 3.6–5.1)
Alkaline Phosphatase: 77 U/L (ref 33–130)
BUN: 16 mg/dL (ref 7–25)
CALCIUM: 9.8 mg/dL (ref 8.6–10.4)
CHLORIDE: 105 mmol/L (ref 98–110)
CO2: 23 mmol/L (ref 20–31)
CREATININE: 0.94 mg/dL (ref 0.50–1.05)
GFR, EST AFRICAN AMERICAN: 77 mL/min (ref >=60)
GFR, Est Non African American: 67 mL/min (ref >=60)
Glucose, Bld: 97 mg/dL (ref 65–99)
POTASSIUM: 3.7 mmol/L (ref 3.5–5.3)
Sodium: 137 mmol/L (ref 135–146)
Total Bilirubin: 0.6 mg/dL (ref 0.2–1.2)
Total Protein: 7 g/dL (ref 6.1–8.1)

## 2016-01-24 LAB — LIPID PANEL
CHOL/HDL RATIO: 3.3 ratio (ref <5.0)
CHOLESTEROL: 196 mg/dL (ref <200)
HDL: 60 mg/dL (ref >50)
LDL Cholesterol: 102 mg/dL — ABNORMAL HIGH (ref <100)
TRIGLYCERIDES: 170 mg/dL — AB (ref <150)
VLDL: 34 mg/dL — AB (ref <30)
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
## 2016-01-24 NOTE — Patient Instructions (Addendum)
As discussed - I recommend meeting with therapist to discuss anxiety further. Additionally if you still require xanax more than once or twice a week in next few months would recommend another type of anxiety medication daily to lessen need of xanax.   If blood count is still low, would recommend meeting with hematologist to discuss anemia further.   Continue follow up with orthopaedist. You should be able to access needed information form Mychart. Let me know if other info needed.    IF you received an x-ray today, you will receive an invoice from [facility]. Please contact [facility] at [phone] with questions or concerns regarding your invoice.   IF you received labwork today, you will receive an invoice from [employer]. Please contact [facility] at [phone] with questions or concerns regarding your invoice.   Our billing staff will not be able to assist you with questions regarding bills from these companies.  You will be contacted with the lab results as soon as they are available. The fastest way to get your results is to activate your My Chart account. Instructions are located on the last page of this paperwork. If you have not heard from [place] regarding the results in 2 weeks, please contact this office.

## 2016-02-01 ENCOUNTER — Telehealth: Payer: Self-pay | Admitting: Hematology

## 2016-02-01 DIAGNOSIS — Z01419 Encounter for gynecological examination (general) (routine) without abnormal findings: Secondary | ICD-10-CM | POA: Diagnosis not present

## 2016-02-01 DIAGNOSIS — Z1231 Encounter for screening mammogram for malignant neoplasm of breast: Secondary | ICD-10-CM | POA: Diagnosis not present

## 2016-02-01 DIAGNOSIS — Z6822 Body mass index (BMI) 22.0-22.9, adult: Secondary | ICD-10-CM | POA: Diagnosis not present

## 2016-02-01 NOTE — Telephone Encounter (Signed)
Pt confirmed appt, verified demo and insurance, pt wasn't sure why she was referred, scheduled appt but wants to discuss with referring provider. Mailed out letter

## 2016-02-02 ENCOUNTER — Other Ambulatory Visit: Payer: Self-pay | Admitting: Family Medicine

## 2016-02-02 DIAGNOSIS — I10 Essential (primary) hypertension: Secondary | ICD-10-CM

## 2016-02-02 NOTE — Telephone Encounter (Signed)
Patient wants to follow up with the e-mail to stephanie,  A lot of missing information.    [phone]

## 2016-02-09 DIAGNOSIS — L603 Nail dystrophy: Secondary | ICD-10-CM | POA: Diagnosis not present

## 2016-02-09 DIAGNOSIS — Z85828 Personal history of other malignant neoplasm of skin: Secondary | ICD-10-CM | POA: Diagnosis not present

## 2016-02-09 DIAGNOSIS — D2222 Melanocytic nevi of left ear and external auricular canal: Secondary | ICD-10-CM | POA: Diagnosis not present

## 2016-02-09 DIAGNOSIS — D225 Melanocytic nevi of trunk: Secondary | ICD-10-CM | POA: Diagnosis not present

## 2016-02-10 ENCOUNTER — Ambulatory Visit (INDEPENDENT_AMBULATORY_CARE_PROVIDER_SITE_OTHER): Payer: BLUE CROSS/BLUE SHIELD | Admitting: Physician Assistant

## 2016-02-10 ENCOUNTER — Ambulatory Visit: Payer: Self-pay | Admitting: Hematology and Oncology

## 2016-02-10 ENCOUNTER — Telehealth: Payer: Self-pay

## 2016-02-10 DIAGNOSIS — K068 Other specified disorders of gingiva and edentulous alveolar ridge: Secondary | ICD-10-CM

## 2016-02-10 NOTE — Progress Notes (Signed)
[facility] [address] [place] [address] [phone]  Date:  02/10/2016   Name:  Dawn Casey   DOB:  09-13-1955   MRN:  [id number]  PCP:  Wendie Agreste, MD    History of Present Illness:  Dawn Casey is a 60 y.o. female patient who presents to [facility] for cc of bleeding with flossing.  Mouth pain for 1 week with easy bleeding with flossing.  No lesions or trauma.  She consistently flosses.  No nose bleeds.  No fatigue.  She would also like to have all her records placed here.   Recent hair loss with  No change in skin from oily vs dry.    Patient has concerns of bleeding gums about 1 week ago.  Taking tongue and pushing against lower teeth, can feel painful and tender.  No nose bleeds.  No family hx of bleeding disorders.  She is eating healthy and without a lot of hardened foods, but does eat them occasionally.  She thought she noted a blister at the bottom of her mouth.   She flosses every day.        Patient Active Problem List   Diagnosis Date Noted  . Osteoarthritis of right hip 01/24/2016  . Generalized anxiety disorder 01/16/2014  . Anemia 01/16/2014  . Essential hypertension 01/16/2014    Past Medical History:  Diagnosis Date  . Allergy   . Anxiety   . Degenerative joint disease (DJD) of lumbar spine   . Hip arthritis    Per rads.   . Hypertension   . Skin cancer 08/12/2015   Per patient's report.     Past Surgical History:  Procedure Laterality Date  . APPENDECTOMY    . BASAL CELL CARCINOMA EXCISION  08/17/2015   From face    Social History  Substance Use Topics  . Smoking status: Never Smoker  . Smokeless tobacco: Not on file  . Alcohol use 3.6 oz/week    6 Standard drinks or equivalent per week    Family History  Problem Relation Age of Onset  . Hypertension Mother   . Diabetes Father   . Hypertension Father     Allergies  Allergen Reactions  . Adhesive [Tape]   . Erythromycin   . Prozac  [Fluoxetine Hcl] Hives  . Sulfa Antibiotics   . Amoxicillin Rash    Medication list has been reviewed and updated.  Current Outpatient Prescriptions on File Prior to Visit  Medication Sig Dispense Refill  . ALPRAZolam (XANAX) 0.5 MG tablet TAKE 1 TO 2 TABLETS EVERY DAY AS NEEDED FOR ANXIETY 90 tablet 0  . Ibuprofen-Famotidine (DUEXIS) 800-26.6 MG TABS Take by mouth.    . IRON PO Take by mouth daily.    Marland Kitchen lisinopril (PRINIVIL,ZESTRIL) 20 MG tablet TAKE ONE TABLET BY MOUTH ONCE DAILY 90 tablet 1  . Multiple Vitamin (MULTI VITAMIN PO) Take by mouth daily.    . propranolol (INDERAL) 20 MG tablet Take 1 tablet (20 mg total) by mouth daily. 90 tablet 1   No current facility-administered medications on file prior to visit.     ROS ROS otherwise unremarkable unless listed above.   Physical Examination: There were no vitals taken for this visit. Ideal Body Weight:    Physical Exam  Constitutional: She is oriented to person, place, and time. She appears well-developed and well-nourished. No distress.  HENT:  Head: Normocephalic and atraumatic.  Right Ear: External ear normal.  Left Ear: External  ear normal.  Mouth/Throat: Oropharynx is clear and moist. No oral lesions. Normal dentition. No dental abscesses or uvula swelling. No oropharyngeal exudate, posterior oropharyngeal edema or posterior oropharyngeal erythema.  Eyes: Conjunctivae and EOM are normal. Pupils are equal, round, and reactive to light.  Cardiovascular: Normal rate.   Pulmonary/Chest: Effort normal. No respiratory distress.  Neurological: She is alert and oriented to person, place, and time.  Skin: She is not diaphoretic.  Psychiatric: She has a normal mood and affect. Her behavior is normal.     Assessment and Plan: Dawn Casey is a 60 y.o. female who is here today for cc of bleeding with flossing. -advised release of information to various facilities to obtain records.   --advised to see dentist for follow  up, also advised warm salt water gargles.   She will return for blood work if this continues, as she will be following up with a hematologist regarding her anemia as well.  Bleeding gums   Ivar Drape, PA-C [facility] 02/10/2016 2:08 PM

## 2016-02-10 NOTE — Patient Instructions (Addendum)
Please try a sensodyne-like toothpaste.  If the symptoms continues, please refer to your dentist, and you can come here so we can follow up with blood work.  I would like you to gargle with warm salt water as well. Please continue to floss.    IF you received an x-ray today, you will receive an invoice from [facility]. Please contact [facility] at [phone] with questions or concerns regarding your invoice.   IF you received labwork today, you will receive an invoice from [place] [facility]. Please contact [facility] at [phone] with questions or concerns regarding your invoice.   Our billing staff will not be able to assist you with questions regarding bills from these companies.  You will be contacted with the lab results as soon as they are available. The fastest way to get your results is to activate your My Chart account. Instructions are located on the last page of this paperwork. If you have not heard from [place] regarding the results in 2 weeks, please contact this office.

## 2016-02-10 NOTE — Telephone Encounter (Signed)
Pt would like Dawn Casey to know the paperwork she gave you. She would like to make a correction. On 02/09/16 she meant to write Dr. Elvera Lennox not Dr. Carlota Raspberry. She does not want a CB.

## 2016-02-15 ENCOUNTER — Ambulatory Visit (HOSPITAL_BASED_OUTPATIENT_CLINIC_OR_DEPARTMENT_OTHER): Payer: BLUE CROSS/BLUE SHIELD | Admitting: Hematology

## 2016-02-15 VITALS — BP 136/87 | HR 99 | Temp 98.6°F | Resp 18 | Wt 137.8 lb

## 2016-02-15 DIAGNOSIS — D649 Anemia, unspecified: Secondary | ICD-10-CM

## 2016-02-15 NOTE — Progress Notes (Signed)
Dawn Casey    HEMATOLOGY/ONCOLOGY CONSULTATION NOTE  Date of Service: 02/15/2016  Patient Care Team: Wendie Agreste, MD as PCP - General (Family Medicine)  CHIEF COMPLAINTS/PURPOSE OF CONSULTATION:  Anemia  HISTORY OF PRESENTING ILLNESS:   Dawn Casey is a wonderful 60 y.o. female who has been referred to [place] by Dr .Carlota Raspberry, Ranell Patrick, MD  for evaluation and management of Anemia.  Patient has a history of basal cell carcinoma on the right fore head status post excision, anxiety, degenerative disease of the spine, hypertension and a history of chronic anemia since atleast 2015.   Patient recently had labs with her primary care physician on 01/24/2016 which showed a hemoglobin of 10.4 with an MCV of 97.8 and normal WBC count and platelets. She previously had a hemoglobin of 10.4 on 09/11/2016 and a hemoglobin of 11.2 on 12/06/2014.  Patient notes that she has had no overt bleeding. She has been taking nearly 2400 mg of ibuprofen a day since February 2017 for hip pain. She notes that she has bilateral hip arthritis and is awaiting hip replacements. She has been on daily multivitamin and has been taking an over-the-counter iron supplement. She also takes hair or nail vitamins.  Denies excessive alcohol use and notes that she consumes 2 drinks a day. Nonsmoker.  Notes that she is having significant nail changes which might be suggestive of a possible autoimmune process or onychomycosis.  Patient notes no other specific focal symptoms.  Patient previously had a hemoglobin of 11 in 2015 and at that time was noted to have fecal occult blood testing which was positive. Patient reports that she had an EGD and colonoscopy at the time which was noted to be negative. We do not have access to these results.  MEDICAL HISTORY:  Past Medical History:  Diagnosis Date  . Allergy   . Anxiety   . Degenerative joint disease (DJD) of lumbar spine   . Hip arthritis    Per rads.   . Hypertension   .  Skin cancer 08/12/2015   Per patient's report.     SURGICAL HISTORY: Past Surgical History:  Procedure Laterality Date  . APPENDECTOMY    . BASAL CELL CARCINOMA EXCISION  08/17/2015   From face    SOCIAL HISTORY: Social History   Social History  . Marital status: Divorced    Spouse name: N/A  . Number of children: N/A  . Years of education: N/A   Occupational History  . Not on file.   Social History Main Topics  . Smoking status: Never Smoker  . Smokeless tobacco: Not on file  . Alcohol use 3.6 oz/week    6 Standard drinks or equivalent per week  . Drug use: No  . Sexual activity: Not on file   Other Topics Concern  . Not on file   Social History Narrative  . No narrative on file    FAMILY HISTORY: Family History  Problem Relation Age of Onset  . Hypertension Mother   . Diabetes Father   . Hypertension Father     ALLERGIES:  is allergic to adhesive [tape]; erythromycin; prozac [fluoxetine hcl]; sulfa antibiotics; and amoxicillin.  MEDICATIONS:  Current Outpatient Prescriptions  Medication Sig Dispense Refill  . ALPRAZolam (XANAX) 0.5 MG tablet TAKE 1 TO 2 TABLETS EVERY DAY AS NEEDED FOR ANXIETY 90 tablet 0  . Ibuprofen-Famotidine (DUEXIS) 800-26.6 MG TABS Take by mouth.    . IRON PO Take by mouth daily.    Dawn Casey lisinopril (PRINIVIL,ZESTRIL)  20 MG tablet TAKE ONE TABLET BY MOUTH ONCE DAILY 90 tablet 1  . Multiple Vitamin (MULTI VITAMIN PO) Take by mouth daily.    Dawn Casey nystatin ointment (MYCOSTATIN) Apply 1 application topically 2 (two) times daily.    . propranolol (INDERAL) 20 MG tablet Take 1 tablet (20 mg total) by mouth daily. 90 tablet 1   No current facility-administered medications for this visit.     REVIEW OF SYSTEMS:    10 Point review of Systems was done is negative except as noted above.  PHYSICAL EXAMINATION: ECOG PERFORMANCE STATUS: 0 - Asymptomatic  . Vitals:   02/15/16 1451  BP: 136/87  Pulse: 99  Resp: 18  Temp: 98.6 F (37 C)    Filed Weights   02/15/16 1451  Weight: 137 lb 12.8 oz (62.5 kg)   .Body mass index is 23.29 kg/m.  GENERAL:alert, in no acute distress and comfortable SKIN: skin color, texture, turgor are normal, no rashes or significant lesions EYES: normal, conjunctiva are pink and non-injected, sclera clear OROPHARYNX:no exudate, no erythema and lips, buccal mucosa, and tongue normal  NECK: supple, no JVD, thyroid normal size, non-tender, without nodularity LYMPH:  no palpable lymphadenopathy in the cervical, axillary or inguinal LUNGS: clear to auscultation with normal respiratory effort HEART: regular rate & rhythm,  no murmurs and no lower extremity edema ABDOMEN: abdomen soft, non-tender, normoactive bowel sounds  Musculoskeletal: no cyanosis of digits and no clubbing  PSYCH: alert & oriented x 3 with fluent speech NEURO: no focal motor/sensory deficits  LABORATORY DATA:  I have reviewed the data as listed   CBC Latest Ref Rng & Units 01/24/2016 09/12/2015 12/06/2014  WBC 3.8 - 10.8 K/uL 5.9 4.4 5.4  Hemoglobin 11.7 - 15.5 g/dL 10.4(L) 10.4(L) 11.2(L)  Hematocrit 35.0 - 45.0 % 31.1(L) 30.9(L) 32.3(L)  Platelets 140 - 400 K/uL 242 278 358   CBC    Component Value Date/Time   WBC 5.9 01/24/2016 1027   RBC 3.18 (L) 01/24/2016 1027   HGB 10.4 (L) 01/24/2016 1027   HCT 31.1 (L) 01/24/2016 1027   PLT 242 01/24/2016 1027   MCV 97.8 01/24/2016 1027   MCV 99.3 (A) 01/16/2014 1443   MCH 32.7 01/24/2016 1027   MCHC 33.4 01/24/2016 1027   RDW 12.8 01/24/2016 1027   LYMPHSABS 1,770 01/24/2016 1027   MONOABS 472 01/24/2016 1027   EOSABS 295 01/24/2016 1027   BASOSABS 59 01/24/2016 1027    CMP Latest Ref Rng & Units 01/24/2016 09/12/2015 12/06/2014  Glucose 65 - 99 mg/dL 97 88 96  BUN 7 - 25 mg/dL _0 Creatinine 0.50 - 1.05 mg/dL 0.94 0.87 0.70  Sodium 135 - 146 mmol/L 137 141 134(L)  Potassium 3.5 - 5.3 mmol/L 3.7 4.0 4.1  Chloride 98 - 110 mmol/L 105 102 92(L)  CO2 20 - 31  mmol/L _1 Calcium 8.6 - 10.4 mg/dL 9.8 9.7 9.9  Total Protein 6.1 - 8.1 g/dL 7.0 6.7 6.9  Total Bilirubin 0.2 - 1.2 mg/dL 0.6 0.6 0.4  Alkaline Phos 33 - 130 U/L 77 63 68  AST 10 - 35 U/L _2 ALT 6 - 29 U/L _3 RADIOGRAPHIC STUDIES: I have personally reviewed the radiological images as listed and agreed with the findings in the report. No results found.  ASSESSMENT & PLAN:   60 year old female with  #1 Chronic anemia since at least 2015. Patient has somewhat macrocytic MCV with normal  RDW suggesting against nutritional deficiencies. I suspect that she is engaging and more alcohol use then she reports and this could be an element. She previously has had positive fecal occult blood testing in December 2015. She reports she had an EGD and colonoscopy which were unrevealing.  She is currently using nearly 2400 mg of ibuprofen daily ports are at high risk for GI losses and ulceration.  She also has unusual nail changes that could suggest an underlying autoimmune condition and could cause anemia of chronic disease.  Plan -We recommended the patient get repeat labs including CBC, CMP -Check for thyroid profile -Myeloma panel -Ferritin, iron profile, RBC folate, B12. -Reticulocyte count -Sedimentation rate and CRP. -She was recommended to cut down her use of NSAIDs. -Follow-up with primary care physician to check fecal occult blood testing to determine possibility of ongoing GI losses. If present the patient may need additional GI workup. -Patient was counseled on need for absolute alcohol cessation at this time. There is a concern that she is consuming more alcohol than she is reporting. Reports drinking 2-3 drinks daily but I suspect it could be more than this. -Consider dermatology/rheumatology evaluation for abnormal nails. -In about workup is unrevealing might consider bone marrow biopsy to rule out contractile RBC disorders which can sometimes present with  skeletal or nail changes. -patient declined doing labs today.  All of the patients questions were answered with apparent satisfaction. The patient knows to call the clinic with any problems, questions or concerns.  I spent 35 minutes counseling the patient face to face. The total time spent in the appointment was 45 minutes and more than 50% was on counseling and direct patient cares.    Sullivan Lone MD [place] AAHIVMS [facility] Hematology/Oncology Physician [facility]  (Office):       [phone] (Work cell):  [phone] (Fax):           [phone]  02/15/2016 3:14 PM

## 2016-02-25 ENCOUNTER — Encounter: Payer: Self-pay | Admitting: Physician Assistant

## 2016-02-29 ENCOUNTER — Encounter: Payer: Self-pay | Admitting: Physician Assistant

## 2016-03-07 NOTE — Telephone Encounter (Signed)
PATIENT STATES WHEN SHE WAS IN THE OFFICE TO SEE STEPHANIE ENGLISH ON 02/10/16 SHE GAVE HER HAND WRITTEN DETAILED NOTES ABOUT HER MEDICAL HISTORY TO GO INTO HER CHART. SHE SAID SHE HAS ALSO SENT HER MESSAGE IN MY-CHART AND SHE STILL HAS NOT HEARD ANYTHING BACK FROM HER. SHE IS TRYING TO GET HER DISABILITY. STEPHANIE SAID SHE WOULD GET BACK IN CONTACT WITH HER. SHE WOULD LIKE TO KNOW WHAT THE STATUS IS? BEST PHONE [phone] (CELL)  [place]

## 2016-03-08 ENCOUNTER — Telehealth: Payer: Self-pay

## 2016-03-08 NOTE — Telephone Encounter (Signed)
Patient is calling to check the status of her disability forms, she said she had left them with Ivar Drape, I know I have not seen anything come across my desk so can you please let me know if you have these because I need to get a copy of them for her chart.  Thank you

## 2016-03-09 DIAGNOSIS — L603 Nail dystrophy: Secondary | ICD-10-CM | POA: Diagnosis not present

## 2016-03-09 DIAGNOSIS — L03031 Cellulitis of right toe: Secondary | ICD-10-CM | POA: Diagnosis not present

## 2016-03-09 DIAGNOSIS — L0889 Other specified local infections of the skin and subcutaneous tissue: Secondary | ICD-10-CM | POA: Diagnosis not present

## 2016-03-11 NOTE — Telephone Encounter (Signed)
There may have been some confusion of the phone call.  She has been emailing me via my-chart, but there were no disability forms obtained.  Her pcp is Dr. Carlota Raspberry.  Besides seeing her 10 days ago.  Ive seen her only once for right hip pain.

## 2016-03-12 NOTE — Telephone Encounter (Signed)
Ok that is fine I have not seen any forms for this patient. Will close the encounter until we get paperwork for her.

## 2016-03-18 ENCOUNTER — Encounter: Payer: Self-pay | Admitting: Physician Assistant

## 2016-03-20 ENCOUNTER — Encounter: Payer: Self-pay | Admitting: Physician Assistant

## 2016-03-20 DIAGNOSIS — M1990 Unspecified osteoarthritis, unspecified site: Secondary | ICD-10-CM | POA: Insufficient documentation

## 2016-03-20 DIAGNOSIS — F1011 Alcohol abuse, in remission: Secondary | ICD-10-CM | POA: Insufficient documentation

## 2016-03-20 DIAGNOSIS — F3289 Other specified depressive episodes: Secondary | ICD-10-CM | POA: Insufficient documentation

## 2016-03-22 ENCOUNTER — Ambulatory Visit (INDEPENDENT_AMBULATORY_CARE_PROVIDER_SITE_OTHER): Payer: BLUE CROSS/BLUE SHIELD

## 2016-03-22 ENCOUNTER — Ambulatory Visit (INDEPENDENT_AMBULATORY_CARE_PROVIDER_SITE_OTHER): Payer: BLUE CROSS/BLUE SHIELD | Admitting: Podiatry

## 2016-03-22 ENCOUNTER — Encounter: Payer: Self-pay | Admitting: Podiatry

## 2016-03-22 VITALS — BP 99/67 | HR 64 | Resp 16 | Ht 65.0 in | Wt 133.0 lb

## 2016-03-22 DIAGNOSIS — M21619 Bunion of unspecified foot: Secondary | ICD-10-CM

## 2016-03-22 DIAGNOSIS — L600 Ingrowing nail: Secondary | ICD-10-CM

## 2016-03-22 NOTE — Patient Instructions (Addendum)
ANTIBACTERIAL SOAP INSTRUCTIONS  THE DAY AFTER PROCEDURE  Please follow the instructions your doctor has marked.   Shower as usual. Before getting out, place a drop of antibacterial liquid soap (Dial) on a wet, clean washcloth.  Gently wipe washcloth over affected area.  Afterward, rinse the area with warm water.  Blot the area dry with a soft cloth and cover with antibiotic ointment (neosporin, polysporin, bacitracin) and band aid or gauze and tape  Place 3-4 drops of antibacterial liquid soap in a quart of warm tap water.  Submerge foot into water for 20 minutes.  If bandage was applied after your procedure, leave on to allow for easy lift off, then remove and continue with soak for the remaining time.  Next, blot area dry with a soft cloth and cover with a bandage.  Apply other medications as directed by your doctor, such as cortisporin otic solution (eardrops) or neosporin antibiotic ointment  Soak toe/toes for approximately 1-2 weeks, allow affected toe to air dry periodically throughout the day when not wearing shoes. When wearing a shoe, apply neosporin as needed with a light bandage to protect the nail bed.  Leave original bandage ( this is the big blue bandage you leave the office wearing) in place for 24 hours if tolerable. Begin first soak after the 24 hour period. If severe pain or throbbing occurs you can begin your first soak before the 24 hour period. Submerge toe with bandages on into soapy water to allow bandages to loosen, then remove bandages. Allow area to air dry before dressing with a bandage.  Drainage for approximately 2-4 weeks is normal,  Discomfort to area is to be expected. Tylenol or ibuprofen can be taken as directed if tolerated.  Any severe pain, blistering, increased swelling or redness needs to be evaluated, please call the office with any questions or concerns Bunionectomy, Care After Refer to this sheet in the next few weeks. These instructions provide you with  information about caring for yourself after your procedure. Your health care provider may also give you more specific instructions. Your treatment has been planned according to current medical practices, but problems sometimes occur. Call your health care provider if you have any problems or questions after your procedure. What can I expect after the procedure? After your procedure, it is typical to have the following:  Pain.  Swelling. Follow these instructions at home:  Take medicines only as directed by your health care provider.  Apply ice to the affected area:  Put ice in a plastic bag.  Place a towel between your skin and the bag.  Leave the ice on for 20 minutes, 2-3 times a day.  There are many different ways to close and cover an incision, including stitches (sutures), skin glue, and adhesive strips. Follow your health care provider's instructions about:  Incision care.  Bandage (dressing) changes and removal.  Incision closure removal.  Keep your foot raised (elevated) while you are resting.  You may need to wear a brace or a boot that keeps your foot in the proper position. Wear the boot or brace as directed by your health care provider.  Use crutches, canes, or walkers as directed by your health care provider.  Do not put weight on your foot until your health care provider approves.  Rest as needed. Follow your health care provider's instructions on when you can return to your usual activities, like walking and driving.  Do not wear high heels or tight-fitting shoes.  Do  physical therapy exercises to strengthen your foot as directed by your health care provider. Contact a health care provider if:  You have increased bleeding from the incision.  You have drainage, redness, swelling, or pain at your incision site.  You have a fever or chills.  You notice a bad smell coming from the incision or the dressing.  Your dressing gets wet or falls off.  Your calf  begins to swell.  Your toes feel numb or stiff. Get help right away if:  You have a rash.  You have difficulty breathing. This information is not intended to replace advice given to you by your health care provider. Make sure you discuss any questions you have with your health care provider. Document Released: 09/01/2004 Document Revised: 07/21/2015 Document Reviewed: 09/30/2013 Elsevier Interactive Patient Education  2017 [employer].

## 2016-03-22 NOTE — Progress Notes (Signed)
Subjective:     Patient ID: Dawn Casey, female   DOB: 08-11-1955, 61 y.o.   MRN: [id number]  HPI patient presents with painful ingrown toenail deformity right hallux medial border that is just finished doxycycline which did help with drainage and is noted to have large structural bunion deformity left over right with redness and pain and inability to wear the vast majority of her shoes   Review of Systems  All other systems reviewed and are negative.      Objective:   Physical Exam  Constitutional: She is oriented to person, place, and time.  Cardiovascular: Intact distal pulses.   Musculoskeletal: Normal range of motion.  Neurological: She is oriented to person, place, and time.  Skin: Skin is warm.  Nursing note and vitals reviewed.  neurovascular status intact muscle strength was adequate range of motion within normal limits with patient noted to have incurvated medial border right hallux with pain with palpation distal necrosis but no active drainage. Structural bunion with redness first metatarsal left over right with pain upon palpation and good digital perfusion and well oriented 3     Assessment:     Ingrown toenail deformity localized right hallux that responded well to antibiotics but still very painful and structural bunion deformity left over right    Plan:     H&P conditions reviewed and I've recommended correction of the ingrown and patient wants left bunion fixed due to pain deformity and family history. I explained risk associated with ingrown and procedure and today I infiltrated 60 mg like Marcaine mixture removed medial border exposed matrix and applied phenol 3 applications 30 seconds followed by alcohol lavage and sterile dressing. Discussed correction of left bunion and this will be done  X-rays taken bilateral revealed structural bunion deformity left over right with elevation of the 1 to intermetatarsal angle approximate 16. Left over right

## 2016-03-22 NOTE — Progress Notes (Signed)
   Subjective:    Patient ID: Dawn Casey, female    DOB: [birth year], 61 y.o.   MRN: [id number]  HPI  Chief Complaint  Patient presents with  . Nail Problem    Right; Great Toe; Medial Side with "Growth and Pus" x 3 weeks. PT states that her dermatologist prescribed Clindamycin 300 MG TID for 7 days and now on Ciprofloxacin 500 MG BID for 7 days for the growth on right toe.       Review of Systems     Objective:   Physical Exam        Assessment & Plan:

## 2016-03-24 ENCOUNTER — Encounter: Payer: Self-pay | Admitting: Physician Assistant

## 2016-03-25 ENCOUNTER — Telehealth: Payer: Self-pay | Admitting: Sports Medicine

## 2016-03-25 DIAGNOSIS — L089 Local infection of the skin and subcutaneous tissue, unspecified: Secondary | ICD-10-CM

## 2016-03-25 MED ORDER — CLINDAMYCIN HCL 300 MG PO CAPS: 300.0000 mg | ORAL_CAPSULE | Freq: Three times a day (TID) | ORAL | 0 refills | Status: DC

## 2016-03-25 NOTE — Telephone Encounter (Signed)
Patient called stating that she had a procedure done on her toe in office and has noticed puss. States that she has been cleansing area with antibacterial soap and water and leaving it open to air. I advised patient to change to soak with Epsom salt. Patient states that she does not have any so I advised patient that she could use betadine or vinegar instead. I also told patient to cover area during day with antibiotic cream and bandage however may leave open to air at night. I sent Clindamycin to pharmacy and advised patient to take with food or probiotic. I encouraged patient to call on Monday for appointment if not better for follow up evaluation. -Dr. Cannon Kettle

## 2016-03-26 ENCOUNTER — Ambulatory Visit (INDEPENDENT_AMBULATORY_CARE_PROVIDER_SITE_OTHER): Payer: BLUE CROSS/BLUE SHIELD | Admitting: Podiatry

## 2016-03-26 ENCOUNTER — Encounter: Payer: Self-pay | Admitting: Podiatry

## 2016-03-26 VITALS — Temp 96.6°F

## 2016-03-26 DIAGNOSIS — L089 Local infection of the skin and subcutaneous tissue, unspecified: Secondary | ICD-10-CM | POA: Diagnosis not present

## 2016-03-26 DIAGNOSIS — M21619 Bunion of unspecified foot: Secondary | ICD-10-CM | POA: Diagnosis not present

## 2016-03-26 NOTE — Patient Instructions (Signed)
Pre-Operative Instructions  Congratulations, you have decided to take an important step to improving your quality of life.  You can be assured that the doctors of [facility] will be with you every step of the way.  1. Plan to be at the surgery center/hospital at least 1 (one) hour prior to your scheduled time unless otherwise directed by the surgical center/hospital staff.  You must have a responsible adult accompany you, remain during the surgery and drive you home.  Make sure you have directions to the surgical center/hospital and know how to get there on time. 2. For hospital based surgery you will need to obtain a history and physical form from your family physician within 1 month prior to the date of surgery- we will give you a form for you primary physician.  3. We make every effort to accommodate the date you request for surgery.  There are however, times where surgery dates or times have to be moved.  We will contact you as soon as possible if a change in schedule is required.   4. No Aspirin/Ibuprofen for one week before surgery.  If you are on aspirin, any non-steroidal anti-inflammatory medications (Mobic, Aleve, Ibuprofen) you should stop taking it 7 days prior to your surgery.  You make take Tylenol  For pain prior to surgery.  5. Medications- If you are taking daily heart and blood pressure medications, seizure, reflux, allergy, asthma, anxiety, pain or diabetes medications, make sure the surgery center/hospital is aware before the day of surgery so they may notify you which medications to take or avoid the day of surgery. 6. No food or drink after midnight the night before surgery unless directed otherwise by surgical center/hospital staff. 7. No alcoholic beverages 24 hours prior to surgery.  No smoking 24 hours prior to or 24 hours after surgery. 8. Wear loose pants or shorts- loose enough to fit over bandages, boots, and casts. 9. No slip on shoes, sneakers are best. 10. Bring  your boot with you to the surgery center/hospital.  Also bring crutches or a Brosseau if your physician has prescribed it for you.  If you do not have this equipment, it will be provided for you after surgery. 11. If you have not been contracted by the surgery center/hospital by the day before your surgery, call to confirm the date and time of your surgery. 12. Leave-time from work may vary depending on the type of surgery you have.  Appropriate arrangements should be made prior to surgery with your employer. 13. Prescriptions will be provided immediately following surgery by your doctor.  Have these filled as soon as possible after surgery and take the medication as directed. 14. Remove nail polish on the operative foot. 15. Wash the night before surgery.  The night before surgery wash the foot and leg well with the antibacterial soap provided and water paying special attention to beneath the toenails and in between the toes.  Rinse thoroughly with water and dry well with a towel.  Perform this wash unless told not to do so by your physician.  Enclosed: 1 Ice pack (please put in freezer the night before surgery)   1 Hibiclens skin cleaner   Pre-op Instructions  If you have any questions regarding the instructions, do not hesitate to call our office.  [place]: [address] [place], [place] [address] [phone]  [place]: [address] [place], [place] [address] [phone]  [place]: [address]  , [place] [address] [phone]   Dr.   Norman Regal DPM, Dr. Matthew Wagoner DPM, Dr. M. Todd Hyatt DPM, Dr. Titorya Stover DPM 

## 2016-03-28 NOTE — Progress Notes (Signed)
Subjective:     Patient ID: Dawn Casey, female   DOB: Oct 12, 1955, 61 y.o.   MRN: [id number]  HPI patient presents stating that he is still having with her feet if she's on it for too long period of time and she does have structural deformity   Review of Systems     Objective:   Physical Exam Neurovascular status intact with structural deformity and history of mild toe infection    Assessment:     Tendinitis with depression of the arch and mild localized toe infection with no proximal edema erythema noted    Plan:     Today I went ahead and I've advised this patient on long-term orthotics and scanned for customized orthotic devices to reduce stress against the feet and patient will soak the toe and be seen back if any further redness or drainage should occur

## 2016-03-29 ENCOUNTER — Encounter: Payer: Self-pay | Admitting: Physician Assistant

## 2016-03-29 ENCOUNTER — Telehealth: Payer: Self-pay | Admitting: *Deleted

## 2016-03-29 ENCOUNTER — Ambulatory Visit: Payer: BLUE CROSS/BLUE SHIELD | Admitting: Podiatry

## 2016-03-29 NOTE — Telephone Encounter (Signed)
Have no idea without seeing. Probably ok

## 2016-03-29 NOTE — Telephone Encounter (Addendum)
Pt states she was in 03/26/2016 and was put on antibiotic, today the toe is swollen and filled with pus again. I spoke with pt, she states the pus goes all the way around the toe and she has been soaking in a tub of 2C epsom salt and several gallons of water because it helps her hip. I told pt not to soak in the tub, to soak in a basin of 1/2C Epsom salt and 1Qt of warm water for 20 minutes 2 times daily and to switch to bacitracin. I told pt I could get her in to see a doctor today or tomorrow, and she states she would like to know what Dr. Paulla Dolly said before scheduling. Dr. Paulla Dolly reviewed pt's chart and recommended pt be seen prior to the weekend. I informed pt and transferred to schedulers.

## 2016-03-30 ENCOUNTER — Encounter: Payer: Self-pay | Admitting: Podiatry

## 2016-03-30 ENCOUNTER — Ambulatory Visit (INDEPENDENT_AMBULATORY_CARE_PROVIDER_SITE_OTHER): Payer: BLUE CROSS/BLUE SHIELD | Admitting: Podiatry

## 2016-03-30 DIAGNOSIS — L03031 Cellulitis of right toe: Secondary | ICD-10-CM

## 2016-03-31 NOTE — Progress Notes (Signed)
Subjective:     Patient ID: Dawn Casey, female   DOB: Mar 04, 1955, 61 y.o.   MRN: [id number]  HPI patient states her nails seems to be healing well but on the lateral side of the big toe there is a blister and she is concerned about infection associated with this and wanted to have it checked   Review of Systems     Objective:   Physical Exam Neurovascular status intact with patient found have a blister on the right side of the hallux localized in nature with no proximal edema erythema drainage noted    Assessment:     Localized abscess that most likely is sterile right hallux lateral side    Plan:     Using sterile instrumentation I drained the abscess today and noted a clear tight fluid indicating that this is not infection. I went ahead and applied sterile dressing and advised on soaks and that if any other changes should occur to let us know immediately and we will still do her foot surgery on left foot in 1 month that she is encouraged to call with any questions

## 2016-04-03 ENCOUNTER — Ambulatory Visit: Payer: BLUE CROSS/BLUE SHIELD | Admitting: Podiatry

## 2016-04-09 ENCOUNTER — Other Ambulatory Visit: Payer: Self-pay | Admitting: Family Medicine

## 2016-04-09 ENCOUNTER — Telehealth: Payer: Self-pay | Admitting: *Deleted

## 2016-04-09 DIAGNOSIS — I10 Essential (primary) hypertension: Secondary | ICD-10-CM

## 2016-04-09 NOTE — Telephone Encounter (Signed)
Pt states she is scheduled for surgery with Dr. Paulla Dolly for 04/17/2016 and has questions about her surgery.

## 2016-04-10 NOTE — Telephone Encounter (Signed)
"  I went on your website and I was reading your information regarding bunions.  I still have an eighth inch of pus around my toe.  He said it was fine when I was there last.  I'm supposed to have a hip replacement on the side that the toe is on.  I can't walk around the house barefoot because of the toe.  I am walking differently and it's messing up my hip.  I don't think I can proceed with the surgery.  I don't think insurance will cover the surgery because I'm not in pain.  I don't know why he considers me a severe case when the bunion does not bother me.  I will not be able to drive.  I am single. I live alone.  I do not want to do anything else that requires healing.  I cut my leg shaving the other day and it is not healing properly.  I'm on Duexis, a pain medication, for my hip.  I'm not trying to cause issues with you guys.  Tell Dr. Paulla Dolly this is nothing about him.  I just don't want to make the situation worse."  I will let him know and I will cancel your surgery.  "I have a surgical shoe do I need to bring it back?"  You can bring it back if you like.        I called and left Caren Griffins a message to cancel patient's surgery scheduled for 04/17/2016.

## 2016-04-10 NOTE — Telephone Encounter (Signed)
I attempted to return her call.  I left her a message to call me back. 

## 2016-04-25 ENCOUNTER — Other Ambulatory Visit: Payer: BLUE CROSS/BLUE SHIELD

## 2016-05-08 DIAGNOSIS — M1611 Unilateral primary osteoarthritis, right hip: Secondary | ICD-10-CM | POA: Diagnosis not present

## 2016-05-09 DIAGNOSIS — L603 Nail dystrophy: Secondary | ICD-10-CM | POA: Diagnosis not present

## 2016-05-09 DIAGNOSIS — L408 Other psoriasis: Secondary | ICD-10-CM | POA: Diagnosis not present

## 2016-05-30 DIAGNOSIS — M1611 Unilateral primary osteoarthritis, right hip: Secondary | ICD-10-CM | POA: Diagnosis not present

## 2016-06-08 DIAGNOSIS — M25551 Pain in right hip: Secondary | ICD-10-CM | POA: Diagnosis not present

## 2016-06-08 DIAGNOSIS — L603 Nail dystrophy: Secondary | ICD-10-CM | POA: Diagnosis not present

## 2016-06-26 ENCOUNTER — Other Ambulatory Visit: Payer: Self-pay | Admitting: Family Medicine

## 2016-06-26 DIAGNOSIS — F4323 Adjustment disorder with mixed anxiety and depressed mood: Secondary | ICD-10-CM

## 2016-06-28 NOTE — Telephone Encounter (Signed)
10/2015 last refill  12.2017 last ov

## 2016-06-28 NOTE — Telephone Encounter (Signed)
I refilled for #30 as planned 6 month follow up to discuss symptoms further at [place] in November. Please follow up prior to those running out.

## 2016-06-29 DIAGNOSIS — M169 Osteoarthritis of hip, unspecified: Secondary | ICD-10-CM | POA: Diagnosis not present

## 2016-06-29 DIAGNOSIS — L603 Nail dystrophy: Secondary | ICD-10-CM | POA: Diagnosis not present

## 2016-06-29 DIAGNOSIS — M25551 Pain in right hip: Secondary | ICD-10-CM | POA: Diagnosis not present

## 2016-06-29 NOTE — Telephone Encounter (Signed)
Called to [facility], l/m needs ov for refills

## 2016-07-06 ENCOUNTER — Encounter: Payer: Self-pay | Admitting: Family Medicine

## 2016-07-09 ENCOUNTER — Other Ambulatory Visit: Payer: Self-pay | Admitting: Physician Assistant

## 2016-07-09 DIAGNOSIS — I10 Essential (primary) hypertension: Secondary | ICD-10-CM

## 2016-07-24 ENCOUNTER — Other Ambulatory Visit: Payer: Self-pay | Admitting: Family Medicine

## 2016-07-24 ENCOUNTER — Telehealth: Payer: Self-pay | Admitting: Family Medicine

## 2016-07-24 DIAGNOSIS — I10 Essential (primary) hypertension: Secondary | ICD-10-CM

## 2016-07-24 NOTE — Telephone Encounter (Signed)
Pt wanted Dr. Carlota Raspberry and Ivar Drape to know that she sent records over from Dr. Lenna Gilford at [facility] office including blood work on 07/06/16. Pt is in good health besides needing hip replacement and goes back to [facility] in July. Pt said this is the most up to date information for any medication refills she needs. Pt can be reached at [phone]. Pt said [facility] faxed refill request on 07/06/16.

## 2016-07-25 NOTE — Telephone Encounter (Signed)
04/11/16 last refill 01/2016 last ov

## 2016-08-22 ENCOUNTER — Encounter: Payer: Self-pay | Admitting: Podiatry

## 2016-08-22 ENCOUNTER — Ambulatory Visit (INDEPENDENT_AMBULATORY_CARE_PROVIDER_SITE_OTHER): Payer: BLUE CROSS/BLUE SHIELD | Admitting: Podiatry

## 2016-08-22 ENCOUNTER — Telehealth: Payer: Self-pay | Admitting: Podiatry

## 2016-08-22 DIAGNOSIS — L03031 Cellulitis of right toe: Secondary | ICD-10-CM | POA: Diagnosis not present

## 2016-08-22 NOTE — Telephone Encounter (Signed)
Pt called back and left vm for me to call her back.   I returned call and she is coming today at 345 pm.

## 2016-08-22 NOTE — Telephone Encounter (Signed)
Pt left vm on nurse line about right great toenail ingrown that was partially removed previously.not grown back fully and pus.it was lanced previously.  Left message for pt to call to schedule an appt.

## 2016-08-23 DIAGNOSIS — M1611 Unilateral primary osteoarthritis, right hip: Secondary | ICD-10-CM | POA: Diagnosis not present

## 2016-08-23 NOTE — Progress Notes (Signed)
Subjective:    Patient ID: Dawn Casey, female   DOB: 61 y.o.   MRN: [id number]   HPI patient presents stating she was just concerned about her toenail and feels like it's crusted over and she has not noticed drainage at the current time    ROS      Objective:  Physical Exam neurovascular status intact negative Homans sign was noted with patient's right hallux showing crusted tissue in the medial side of the toe with no active drainage noted     Assessment:    Possibility for mild localized paronychia infection of the right hallux localized in nature     Plan:   Debridement accomplished with no active drainage noted flushed the area applied dressing and instructed on soaking for a few days. This should be uneventful and healing

## 2016-08-25 ENCOUNTER — Other Ambulatory Visit: Payer: Self-pay | Admitting: Family Medicine

## 2016-08-25 DIAGNOSIS — I10 Essential (primary) hypertension: Secondary | ICD-10-CM

## 2016-08-28 NOTE — Telephone Encounter (Signed)
mychart message sent to pt about making an appt °

## 2016-09-25 ENCOUNTER — Encounter: Payer: Self-pay | Admitting: Family Medicine

## 2016-09-25 ENCOUNTER — Telehealth: Payer: Self-pay

## 2016-09-25 ENCOUNTER — Ambulatory Visit (INDEPENDENT_AMBULATORY_CARE_PROVIDER_SITE_OTHER): Payer: BLUE CROSS/BLUE SHIELD | Admitting: Family Medicine

## 2016-09-25 VITALS — BP 112/76 | HR 68 | Temp 98.1°F | Resp 17 | Ht 65.5 in | Wt 128.0 lb

## 2016-09-25 DIAGNOSIS — Z862 Personal history of diseases of the blood and blood-forming organs and certain disorders involving the immune mechanism: Secondary | ICD-10-CM | POA: Diagnosis not present

## 2016-09-25 DIAGNOSIS — Z113 Encounter for screening for infections with a predominantly sexual mode of transmission: Secondary | ICD-10-CM | POA: Diagnosis not present

## 2016-09-25 DIAGNOSIS — I10 Essential (primary) hypertension: Secondary | ICD-10-CM | POA: Diagnosis not present

## 2016-09-25 DIAGNOSIS — Z0000 Encounter for general adult medical examination without abnormal findings: Secondary | ICD-10-CM

## 2016-09-25 DIAGNOSIS — F4323 Adjustment disorder with mixed anxiety and depressed mood: Secondary | ICD-10-CM

## 2016-09-25 DIAGNOSIS — Z23 Encounter for immunization: Secondary | ICD-10-CM | POA: Diagnosis not present

## 2016-09-25 DIAGNOSIS — Z13220 Encounter for screening for lipoid disorders: Secondary | ICD-10-CM | POA: Diagnosis not present

## 2016-09-25 DIAGNOSIS — Z131 Encounter for screening for diabetes mellitus: Secondary | ICD-10-CM

## 2016-09-25 LAB — POCT URINALYSIS DIP (MANUAL ENTRY)
BILIRUBIN UA: NEGATIVE
Blood, UA: NEGATIVE
GLUCOSE UA: NEGATIVE mg/dL
Ketones, POC UA: NEGATIVE mg/dL
NITRITE UA: NEGATIVE
Protein Ur, POC: NEGATIVE mg/dL
Spec Grav, UA: 1.02 (ref 1.010–1.025)
UROBILINOGEN UA: 0.2 U/dL
pH, UA: 6.5 (ref 5.0–8.0)

## 2016-09-25 MED ORDER — ZOSTER VAC RECOMB ADJUVANTED 50 MCG/0.5ML IM SUSR: 0.5000 mL | Freq: Once | INTRAMUSCULAR | 1 refills | Status: AC

## 2016-09-25 MED ORDER — ALPRAZOLAM 0.5 MG PO TABS: 0.2500 mg | ORAL_TABLET | Freq: Two times a day (BID) | ORAL | 0 refills | Status: DC | PRN

## 2016-09-25 MED ORDER — LISINOPRIL 20 MG PO TABS: 20.0000 mg | ORAL_TABLET | Freq: Every day | ORAL | 1 refills | Status: DC

## 2016-09-25 MED ORDER — PROPRANOLOL HCL 20 MG PO TABS: 20.0000 mg | ORAL_TABLET | Freq: Every day | ORAL | 1 refills | Status: DC

## 2016-09-25 NOTE — Progress Notes (Signed)
Subjective:  This chart was scribed for Wendie Agreste, MD by Tamsen Roers, at [place] [facility].  This patient was seen in room 12 and the patient's care was started at 11:30 AM.   Chief Complaint  Patient presents with  . Annual Exam     Patient ID: Dawn Casey, female    DOB: 07-23-1955, 61 y.o.   MRN: [id number]  HPI HPI Comments: Dawn Casey is a 61 y.o. female who presents to Primary Care at [facility] for an annual physical exam. She was last seen by me in November 2017 as a transfer of care from Dr. Everlene Farrier, then Tor Netters when I first saw her in July. She has a history of generalized anxiety, anemia and hypertension. Patient has two glasses of wine per day on average.   She is concerned about her (left hand) third nail which has been darkening.  She states that the nail was initially a "greenish" color.  She had the nail cultured at the dermatologist and nothing concerning found. She then went to the rheumatologist who did not find anything concerning as well.   Patient denies any recent exposures or risks to STI's.  She would like to have blood work completed today and be tested for [employer].   Anemia: She was referred to hematology in December 2017.-- She did not show any signs of anemia at her rheumatology visit and takes 1 iron supplement per day.  Lab Results  Component Value Date   WBC 5.9 01/24/2016   HGB 10.4 (L) 01/24/2016   HCT 31.1 (L) 01/24/2016   MCV 97.8 01/24/2016   PLT 242 01/24/2016    Anxiety: She uses alprazolam .5 mg as needed last prescribed May 3. -- Patient takes Xanax about one time per week (usually when she has to perform).     Hypertension: Takes Lisinopril 20 mg once per day and Inderal 20 mg once per day.---  Patient checks her blood pressure regularly and states that it is variable.  Always around 120's or less.   Lab Results  Component Value Date   CREATININE 0.94 01/24/2016    Cancer screening: Colon Cancer  Screening: Colonscopy: June 2016  Patient had an endoscopy completed as well.  No polyps were found.  Breast cancer screening: mammogram:December 2016.  Thinks it may have been at [facility] gynecology. Completed this past year.  Pap testing:  December 2016.  Completed this past year.   Immunizations:  There is no immunization history on file for this patient. Tetanus: Patient is up to date with this.  Shingles: She has not had a shingles vaccination.   Patient recently had a hepatitis C screening completed at her rheumatology visit.    Depression: Depression screen [facility] 2/9 02/10/2016 09/09/2015 06/24/2015 12/28/2014 12/06/2014  Decreased Interest 0 0 0 0 0  Down, Depressed, Hopeless 0 0 0 0 0  PHQ - 2 Score 0 0 0 0 0    Vision:She is compliant with going to the eye doctor. She recently has gotten new eye glasses.   Visual Acuity Screening   Right eye Left eye Both eyes  Without correction: '20/20 20/20 20/20 '  With correction:      Exercise: Patient swims three times per week.  She does not do any weight bearing exercises due to her hip.  She will get a hip replacement in the future but her doctor has said he is not ready for her to get one yet.   Dentist: Patient  is compliant with going every 6 months.    Patient Active Problem List   Diagnosis Date Noted  . History of alcohol abuse 03/20/2016  . Other depressive episodes 03/20/2016  . Degenerative joint disease 03/20/2016  . Osteoarthritis of right hip 01/24/2016  . Generalized anxiety disorder 01/16/2014  . Anemia 01/16/2014  . Essential hypertension 01/16/2014   Past Medical History:  Diagnosis Date  . Allergy   . Anxiety   . Degenerative joint disease (DJD) of lumbar spine   . Hip arthritis    Per rads.   . Hypertension   . Skin cancer 08/12/2015   Per patient's report.    Past Surgical History:  Procedure Laterality Date  . APPENDECTOMY    . BASAL CELL CARCINOMA EXCISION  08/17/2015   From face   Allergies    Allergen Reactions  . Adhesive [Tape]   . Erythromycin   . Prozac [Fluoxetine Hcl] Hives  . Sulfa Antibiotics   . Amoxicillin Rash   Prior to Admission medications   Medication Sig Start Date End Date Taking? Authorizing Provider  ALPRAZolam Duanne Moron) 0.5 MG tablet TAKE 1 TO 2 TABLETS EVERY DAY AS NEEDED FOR ANXIETY 06/28/16  Yes Wendie Agreste, MD  BIOTIN PO Take by mouth.   Yes [provider]  Ibuprofen-Famotidine (DUEXIS) 800-26.6 MG TABS Take by mouth 3 (three) times daily.    Yes [provider]  IRON PO Take by mouth daily.   Yes [provider]  lisinopril (PRINIVIL,ZESTRIL) 20 MG tablet TAKE ONE TABLET BY MOUTH ONCE DAILY 02/07/16  Yes Wendie Agreste, MD  Multiple Vitamin (MULTI VITAMIN PO) Take by mouth daily.   Yes [provider]  propranolol (INDERAL) 20 MG tablet TAKE 1 TABLET BY MOUTH EVERY DAY 08/28/16  Yes Wendie Agreste, MD   Social History   Social History  . Marital status: Divorced    Spouse name: N/A  . Number of children: N/A  . Years of education: N/A   Occupational History  . Not on file.   Social History Main Topics  . Smoking status: Never Smoker  . Smokeless tobacco: Never Used  . Alcohol use 3.6 oz/week    6 Standard drinks or equivalent per week  . Drug use: No  . Sexual activity: No   Other Topics Concern  . Not on file   Social History Narrative  . No narrative on file     Review of Systems  Constitutional: Negative for chills and fever.  Eyes: Negative for pain and redness.  Respiratory: Negative for cough and choking.   Gastrointestinal: Negative for nausea and vomiting.  Musculoskeletal: Negative for neck pain and neck stiffness.  Skin:       Darkening nail.   Neurological: Negative for syncope and speech difficulty.       Objective:   Physical Exam  Constitutional: She is oriented to person, place, and time. She appears well-developed and well-nourished.  HENT:  Head: Normocephalic  and atraumatic.  Right Ear: External ear normal.  Left Ear: External ear normal.  Mouth/Throat: Oropharynx is clear and moist.  Eyes: Pupils are equal, round, and reactive to light. Conjunctivae are normal.  Neck: Normal range of motion. Neck supple. No thyromegaly present.  Cardiovascular: Normal rate, regular rhythm, normal heart sounds and intact distal pulses.   No murmur heard. Pulmonary/Chest: Effort normal and breath sounds normal. No respiratory distress. She has no wheezes.  Abdominal: Soft. Bowel sounds are normal. There is no tenderness.  Musculoskeletal: Normal range of motion. She exhibits no edema or tenderness.   Discomfort with minimal internal rotation of the right hip, pain free with range of motion of left hip.   Lymphadenopathy:    She has no cervical adenopathy.  Neurological: She is alert and oriented to person, place, and time.  Skin: Skin is warm and dry. No rash noted.  Darkening of left hand third nail.  Psychiatric: She has a normal mood and affect. Her behavior is normal. Thought content normal.  Vitals reviewed.  Vitals:   09/25/16 1101  BP: 112/76  Pulse: 68  Resp: 17  Temp: 98.1 F (36.7 C)  TempSrc: Oral  SpO2: 98%  Weight: 128 lb (58.1 kg)  Height: 5' 5.5" (1.664 m)      Assessment & Plan:   Dawn Casey is a 61 y.o. female Annual physical exam  - -anticipatory guidance as below in AVS, screening labs above. Health maintenance items as above in HPI discussed/recommended as applicable.   Need for shingles vaccine - Plan: Zoster Vac Recomb Adjuvanted [facility]) injection sent to pharmacy.   Routine screening for STI (sexually transmitted infection) - Plan: HIV antibody, RPR, GC/Chlamydia Probe Amp  Screening for hyperlipidemia - Plan: Lipid panel  Screening for diabetes mellitus - Plan: CMP14+EGFR  Essential hypertension - Plan: propranolol (INDERAL) 20 MG tablet, lisinopril (PRINIVIL,ZESTRIL) 20 MG tablet, POCT urinalysis  dipstick  - stable, no med changes ,refilled, labs above  Adjustment disorder with mixed anxiety and depressed mood - Plan: ALPRAZolam (XANAX) 0.5 MG tablet  -Overall stable with regular dosing of Xanax. No change in medication for now, and do not see current need for daily SSRI. Xanax refilled  History of anemia - Plan: CBC with Differential/Platelet  -Repeat CBC. Vitals stable.  No discoloration noted, possible previous injury with onycholysis and healing nail. RTC precautions if not continuing to improve.  Meds ordered this encounter  Medications  . Zoster Vac Recomb Adjuvanted [facility]) injection    Sig: Inject 0.5 mLs into the muscle once. Repeat injection once in 2-6 months.    Dispense:  0.5 mL    Refill:  1  . propranolol (INDERAL) 20 MG tablet    Sig: Take 1 tablet (20 mg total) by mouth daily.    Dispense:  90 tablet    Refill:  1  . lisinopril (PRINIVIL,ZESTRIL) 20 MG tablet    Sig: Take 1 tablet (20 mg total) by mouth daily.    Dispense:  90 tablet    Refill:  1  . ALPRAZolam (XANAX) 0.5 MG tablet    Sig: Take 0.5-1 tablets (0.25-0.5 mg total) by mouth 2 (two) times daily as needed for anxiety.    Dispense:  30 tablet    Refill:  0    This request is for a new prescription for a controlled substance as required by Federal/State law.   Patient Instructions   Nail discoloration should continue to improve, but would discuss with rheumatology and dermatology.  I refilled your blood pressure medications.  Shingles vaccine was sent to your pharmacy.    Keeping You Healthy  Get These Tests  Blood Pressure- Have your blood pressure checked by your healthcare provider at least once a year.  Normal blood pressure is 120/80.  Weight- Have your body mass index (BMI) calculated to screen for obesity.  BMI is a measure of body fat based on height and weight.  You can calculate your own BMI at [employer].it  Cholesterol- Have your cholesterol  checked every  year.  Diabetes- Have your blood sugar checked every year if you have high blood pressure, high cholesterol, a family history of diabetes or if you are overweight.  Pap Test - Have a pap test every 1 to 5 years if you have been sexually active.  If you are older than 65 and recent pap tests have been normal you may not need additional pap tests.  In addition, if you have had a hysterectomy  for benign disease additional pap tests are not necessary.  Mammogram-Yearly mammograms are essential for early detection of breast cancer  Screening for Colon Cancer- Colonoscopy starting at age 71. Screening may begin sooner depending on your family history and other health conditions.  Follow up colonoscopy as directed by your Gastroenterologist.  Screening for Osteoporosis- Screening begins at age 71 with bone density scanning, sooner if you are at higher risk for developing Osteoporosis.  Get these medicines  Calcium with Vitamin D- Your body requires 1200-1500 mg of Calcium a day and [phone] IU of Vitamin D a day.  You can only absorb 500 mg of Calcium at a time therefore Calcium must be taken in 2 or 3 separate doses throughout the day.  Hormones- Hormone therapy has been associated with increased risk for certain cancers and heart disease.  Talk to your healthcare provider about if you need relief from menopausal symptoms.  Aspirin- Ask your healthcare provider about taking Aspirin to prevent Heart Disease and Stroke.  Get these Immuniztions  Flu shot- Every fall  Pneumonia shot- Once after the age of 23; if you are younger ask your healthcare provider if you need a pneumonia shot.  Tetanus- Every ten years.  Zostavax- Once after the age of 45 to prevent shingles.  Take these steps  Don't smoke- Your healthcare provider can help you quit. For tips on how to quit, ask your healthcare provider or go to www.smokefree.gov or call 1-800 QUIT-NOW.  Be physically active- Exercise 5 days a week  for a minimum of 30 minutes.  If you are not already physically active, start slow and gradually work up to 30 minutes of moderate physical activity.  Try walking, dancing, bike riding, swimming, etc.  Eat a healthy diet- Eat a variety of healthy foods such as fruits, vegetables, whole grains, low fat milk, low fat cheeses, yogurt, lean meats, chicken, fish, eggs, dried beans, tofu, etc.  For more information go to www.thenutritionsource.org  Dental visit- Brush and floss teeth twice daily; visit your dentist twice a year.  Eye exam- Visit your Optometrist or Ophthalmologist yearly.  Drink alcohol in moderation- Limit alcohol intake to one drink or less a day.  Never drink and drive.  Depression- Your emotional health is as important as your physical health.  If you're feeling down or losing interest in things you normally enjoy, please talk to your healthcare provider.  Seat Belts- can save your life; always wear one  Smoke/Carbon Monoxide detectors- These detectors need to be installed on the appropriate level of your home.  Replace batteries at least once a year.  Violence- If anyone is threatening or hurting you, please tell your healthcare provider.  Living Will/ Health care power of attorney- Discuss with your healthcare provider and family.   IF you received an x-ray today, you will receive an invoice from [facility] Radiology. Please contact [facility]iology at [phone] with questions or concerns regarding your invoice.   IF you received labwork today, you will receive an invoice from [employer] [facility].  Please contact [facility] at [phone] with questions or concerns regarding your invoice.   Our billing staff will not be able to assist you with questions regarding bills from these companies.  You will be contacted with the lab results as soon as they are available. The fastest way to get your results is to activate your My Chart account. Instructions are located on the last page  of this paperwork. If you have not heard from [place] regarding the results in 2 weeks, please contact this office.      I personally performed the services described in this documentation, which was scribed in my presence. The recorded information has been reviewed and considered for accuracy and completeness, addended by me as needed, and agree with information above.  Signed,   [name], MD Primary [facility] at [facility].  09/30/16 10:15 PM

## 2016-09-25 NOTE — Telephone Encounter (Signed)
Called in rx for [facility]anax [facility].

## 2016-09-25 NOTE — Patient Instructions (Addendum)
Nail discoloration should continue to improve, but would discuss with rheumatology and dermatology.  I refilled your blood pressure medications.  Shingles vaccine was sent to your pharmacy.    Keeping You Healthy  Get These Tests  Blood Pressure- Have your blood pressure checked by your healthcare provider at least once a year.  Normal blood pressure is 120/80.  Weight- Have your body mass index (BMI) calculated to screen for obesity.  BMI is a measure of body fat based on height and weight.  You can calculate your own BMI at [employer]  Cholesterol- Have your cholesterol checked every year.  Diabetes- Have your blood sugar checked every year if you have high blood pressure, high cholesterol, a family history of diabetes or if you are overweight.  Pap Test - Have a pap test every 1 to 5 years if you have been sexually active.  If you are older than 65 and recent pap tests have been normal you may not need additional pap tests.  In addition, if you have had a hysterectomy  for benign disease additional pap tests are not necessary.  Mammogram-Yearly mammograms are essential for early detection of breast cancer  Screening for Colon Cancer- Colonoscopy starting at age 43. Screening may begin sooner depending on your family history and other health conditions.  Follow up colonoscopy as directed by your Gastroenterologist.  Screening for Osteoporosis- Screening begins at age 23 with bone density scanning, sooner if you are at higher risk for developing Osteoporosis.  Get these medicines  Calcium with Vitamin D- Your body requires 1200-1500 mg of Calcium a day and [phone] IU of Vitamin D a day.  You can only absorb 500 mg of Calcium at a time therefore Calcium must be taken in 2 or 3 separate doses throughout the day.  Hormones- Hormone therapy has been associated with increased risk for certain cancers and heart disease.  Talk to your healthcare provider about if you need  relief from menopausal symptoms.  Aspirin- Ask your healthcare provider about taking Aspirin to prevent Heart Disease and Stroke.  Get these Immuniztions  Flu shot- Every fall  Pneumonia shot- Once after the age of 76; if you are younger ask your healthcare provider if you need a pneumonia shot.  Tetanus- Every ten years.  Zostavax- Once after the age of 37 to prevent shingles.  Take these steps  Don't smoke- Your healthcare provider can help you quit. For tips on how to quit, ask your healthcare provider or go to www.smokefree.gov or call 1-800 QUIT-NOW.  Be physically active- Exercise 5 days a week for a minimum of 30 minutes.  If you are not already physically active, start slow and gradually work up to 30 minutes of moderate physical activity.  Try walking, dancing, bike riding, swimming, etc.  Eat a healthy diet- Eat a variety of healthy foods such as fruits, vegetables, whole grains, low fat milk, low fat cheeses, yogurt, lean meats, chicken, fish, eggs, dried beans, tofu, etc.  For more information go to www.thenutritionsource.org  Dental visit- Brush and floss teeth twice daily; visit your dentist twice a year.  Eye exam- Visit your Optometrist or Ophthalmologist yearly.  Drink alcohol in moderation- Limit alcohol intake to one drink or less a day.  Never drink and drive.  Depression- Your emotional health is as important as your physical health.  If you're feeling down or losing interest in things you normally enjoy, please talk to your healthcare provider.  Seat Belts- can save your life;  always wear one  Smoke/Carbon Monoxide detectors- These detectors need to be installed on the appropriate level of your home.  Replace batteries at least once a year.  Violence- If anyone is threatening or hurting you, please tell your healthcare provider.  Living Will/ Health care power of attorney- Discuss with your healthcare provider and family.   IF you received an x-ray today,  you will receive an invoice from [facility] Radiology. Please contact [facility] Radiology at [phone] with questions or concerns regarding your invoice.   IF you received labwork today, you will receive an invoice from [facility]. Please contact [facility] at [phone] with questions or concerns regarding your invoice.   Our billing staff will not be able to assist you with questions regarding bills from these companies.  You will be contacted with the lab results as soon as they are available. The fastest way to get your results is to activate your My Chart account. Instructions are located on the last page of this paperwork. If you have not heard from [place] regarding the results in 2 weeks, please contact this office.

## 2016-09-26 LAB — CBC WITH DIFFERENTIAL/PLATELET
BASOS: 1 %
Basophils Absolute: 0.1 10*3/uL (ref 0.0–0.2)
EOS (ABSOLUTE): 0.1 10*3/uL (ref 0.0–0.4)
EOS: 2 %
HEMATOCRIT: 34.6 % (ref 34.0–46.6)
HEMOGLOBIN: 11.2 g/dL (ref 11.1–15.9)
IMMATURE GRANULOCYTES: 0 %
Immature Grans (Abs): 0 10*3/uL (ref 0.0–0.1)
LYMPHS ABS: 2 10*3/uL (ref 0.7–3.1)
Lymphs: 35 %
MCH: 32.2 pg (ref 26.6–33.0)
MCHC: 32.4 g/dL (ref 31.5–35.7)
MCV: 99 fL — AB (ref 79–97)
MONOCYTES: 7 %
MONOS ABS: 0.4 10*3/uL (ref 0.1–0.9)
NEUTROS PCT: 55 %
Neutrophils Absolute: 3.2 10*3/uL (ref 1.4–7.0)
Platelets: 263 10*3/uL (ref 150–379)
RBC: 3.48 x10E6/uL — AB (ref 3.77–5.28)
RDW: 13 % (ref 12.3–15.4)
WBC: 5.8 10*3/uL (ref 3.4–10.8)

## 2016-09-26 LAB — CMP14+EGFR
ALBUMIN: 4.9 g/dL — AB (ref 3.6–4.8)
ALT: 27 IU/L (ref 0–32)
AST: 52 IU/L — ABNORMAL HIGH (ref 0–40)
Albumin/Globulin Ratio: 1.8 (ref 1.2–2.2)
Alkaline Phosphatase: 98 IU/L (ref 39–117)
BUN / CREAT RATIO: 16 (ref 12–28)
BUN: 15 mg/dL (ref 8–27)
Bilirubin Total: 0.4 mg/dL (ref 0.0–1.2)
CALCIUM: 10 mg/dL (ref 8.7–10.3)
CO2: 19 mmol/L — AB (ref 20–29)
CREATININE: 0.91 mg/dL (ref 0.57–1.00)
Chloride: 106 mmol/L (ref 96–106)
GFR calc Af Amer: 79 mL/min/{1.73_m2} (ref >59)
GFR, EST NON AFRICAN AMERICAN: 69 mL/min/{1.73_m2} (ref >59)
GLOBULIN, TOTAL: 2.8 g/dL (ref 1.5–4.5)
GLUCOSE: 84 mg/dL (ref 65–99)
Potassium: 3.9 mmol/L (ref 3.5–5.2)
SODIUM: 140 mmol/L (ref 134–144)
Total Protein: 7.7 g/dL (ref 6.0–8.5)

## 2016-09-26 LAB — LIPID PANEL
CHOLESTEROL TOTAL: 252 mg/dL — AB (ref 100–199)
Chol/HDL Ratio: 3 ratio (ref 0.0–4.4)
HDL: 84 mg/dL (ref >39)
LDL Calculated: 135 mg/dL — ABNORMAL HIGH (ref 0–99)
TRIGLYCERIDES: 167 mg/dL — AB (ref 0–149)
VLDL Cholesterol Cal: 33 mg/dL (ref 5–40)

## 2016-09-26 LAB — GC/CHLAMYDIA PROBE AMP
Chlamydia trachomatis, NAA: NEGATIVE
NEISSERIA GONORRHOEAE BY PCR: NEGATIVE

## 2016-09-26 LAB — HIV ANTIBODY (ROUTINE TESTING W REFLEX): HIV SCREEN 4TH GENERATION: NONREACTIVE

## 2016-09-26 LAB — RPR: RPR: NONREACTIVE

## 2016-09-30 ENCOUNTER — Other Ambulatory Visit: Payer: Self-pay | Admitting: Family Medicine

## 2016-09-30 DIAGNOSIS — R7989 Other specified abnormal findings of blood chemistry: Secondary | ICD-10-CM

## 2016-09-30 DIAGNOSIS — R945 Abnormal results of liver function studies: Principal | ICD-10-CM

## 2016-11-23 DIAGNOSIS — M1611 Unilateral primary osteoarthritis, right hip: Secondary | ICD-10-CM | POA: Diagnosis not present

## 2016-12-20 ENCOUNTER — Telehealth: Payer: Self-pay

## 2016-12-20 NOTE — Telephone Encounter (Signed)
Pt called back regarding form requested for orthopaedic surgery. Pt states that she does not have the money to pay for an appointment at this time and that her lab work and tests came back normal in July. She is hoping you could fill the medical clearance form out without her having to come in. The form in the C[facility] box. Pt would like to be advised either way.

## 2016-12-20 NOTE — Telephone Encounter (Signed)
Received medical clearance form from [facility]. Called pt and left voicemail to have pt come in to office for visit and to have clearance performed by Carlota Raspberry. Form is in [place] box for [employer].

## 2016-12-20 NOTE — Telephone Encounter (Signed)
Let me take a look at her last visit, and recommendations for clearance to see if I can discuss some of that over the phone or possible orders without an office visit. I should have some more information in the next few days.

## 2016-12-21 DIAGNOSIS — M1611 Unilateral primary osteoarthritis, right hip: Secondary | ICD-10-CM | POA: Diagnosis not present

## 2016-12-27 ENCOUNTER — Telehealth: Payer: Self-pay | Admitting: Family Medicine

## 2016-12-27 NOTE — Telephone Encounter (Signed)
Pt callling about paper work for surgery

## 2016-12-28 ENCOUNTER — Telehealth: Payer: Self-pay | Admitting: Family Medicine

## 2016-12-28 NOTE — Telephone Encounter (Signed)
Preoperative clearance form received. Last seen July 31 for a physical. Hemoglobin was normal at that time with 100 supplement per day. Blood pressure controlled on lisinopril and Inderal. Normal renal function. Single borderline elevated liver function test with AST of 52. ALT normal. No apparent heart murmur on exam. Small leukocytes on urinalysis, but no other signs of infection.  Intermediate risk surgery. Goldman revised cardiac risk index score of 0. Able to achieve at least 4 metabolic equivalents of activity without difficulty at last visit.She was exercising with swimming 3 times per week. Limited weightbearing exercises due to her hip issues.   May need updated CBC and lytes depending on requirements form anesthesiology, and with hip replacement often will check urinalysis to make sure there are no signs of infection.  Can place these orders without office visit if needed. Left message to call back for me to discuss plan with her further.

## 2016-12-28 NOTE — Telephone Encounter (Signed)
Pt returned call for Dr. Carlota Raspberry and she says that Carlota Raspberry can call back over the weekend if he would like. He may leave a detailed a message if he happens to miss her and she will call back with detailed answers.   Best callback # [phone]

## 2016-12-31 NOTE — Telephone Encounter (Signed)
Saw note that you wanted to speak with her. Any updates?

## 2016-12-31 NOTE — Telephone Encounter (Signed)
Discussed with patient. Walking around house, and [employer] due to pain. No prior issues with treadmill walking, and no chest pain or dyspnea with swimming. Able to achieve 4 metabolic equivalents of activity without difficulty other than due to pain from hip. Discussed that she does not present any increased risk of major adverse cardiac event based on this history, would recommended updated blood count, electrolytes, and urinalysis prior to hip replacement. These can be entered as lab only visit if needed.

## 2017-01-02 ENCOUNTER — Ambulatory Visit: Payer: Self-pay | Admitting: Orthopedic Surgery

## 2017-01-08 DIAGNOSIS — M1611 Unilateral primary osteoarthritis, right hip: Secondary | ICD-10-CM | POA: Diagnosis not present

## 2017-01-16 NOTE — Progress Notes (Signed)
12-24-16 Surgical clearance from Dr. Nyoka Cowden on chart and in [facility]

## 2017-01-16 NOTE — Patient Instructions (Addendum)
Dawn Casey  01/16/2017   Your procedure is scheduled on: 01-24-17   Report to [place]  Entrance Take [place] Elevators to 3rd floor to  [place] at 10:15 AM.   Call this number if you have problems the morning of surgery [phone]    Remember: ONLY 1 PERSON MAY GO WITH YOU TO SHORT STAY TO GET  READY MORNING OF [name].  Do not eat food or drink liquids :After Midnight.     Take these medicines the morning of surgery with A SIP OF WATER: Propranolol (Inderal)                                You may not have any metal on your body including hair pins and              piercings  Do not wear jewelry, make-up, lotions, powders or perfumes, deodorant             Do not wear nail polish.  Do not shave  48 hours prior to surgery.               Do not bring valuables to the hospital. [place].  Contacts, dentures or bridgework may not be worn into surgery.  Leave suitcase in the car. After surgery it may be brought to your room.                 Please read over the following fact sheets you were given: _____________________________________________________________________             [facility] - Preparing for Surgery Before surgery, you can play an important role.  Because skin is not sterile, your skin needs to be as free of germs as possible.  You can reduce the number of germs on your skin by washing with CHG (chlorahexidine gluconate) soap before surgery.  CHG is an antiseptic cleaner which kills germs and bonds with the skin to continue killing germs even after washing. Please DO NOT use if you have an allergy to CHG or antibacterial soaps.  If your skin becomes reddened/irritated stop using the CHG and inform your nurse when you arrive at Short Stay. Do not shave (including legs and underarms) for at least 48 hours prior to the first CHG shower.  You may shave your  face/neck. Please follow these instructions carefully:  1.  Shower with CHG Soap the night before surgery and the  morning of Surgery.  2.  If you choose to wash your hair, wash your hair first as usual with your  normal  shampoo.  3.  After you shampoo, rinse your hair and body thoroughly to remove the  shampoo.                           4.  Use CHG as you would any other liquid soap.  You can apply chg directly  to the skin and wash                       Gently with a scrungie or clean washcloth.  5.  Apply the CHG Soap to your body ONLY FROM  THE NECK DOWN.   Do not use on face/ open                           Wound or open sores. Avoid contact with eyes, ears mouth and genitals (private parts).                       Wash face,  Genitals (private parts) with your normal soap.             6.  Wash thoroughly, paying special attention to the area where your surgery  will be performed.  7.  Thoroughly rinse your body with warm water from the neck down.  8.  DO NOT shower/wash with your normal soap after using and rinsing off  the CHG Soap.                9.  Pat yourself dry with a clean towel.            10.  Wear clean pajamas.            11.  Place clean sheets on your bed the night of your first shower and do not  sleep with pets. Day of Surgery : Do not apply any lotions/deodorants the morning of surgery.  Please wear clean clothes to the hospital/surgery center.  FAILURE TO FOLLOW THESE INSTRUCTIONS MAY RESULT IN THE CANCELLATION OF YOUR SURGERY PATIENT SIGNATURE_________________________________  NURSE SIGNATURE__________________________________  ________________________________________________________________________   [name]  An incentive spirometer is a tool that can help keep your lungs clear and active. This tool measures how well you are filling your lungs with each breath. Taking long deep breaths may help reverse or decrease the chance of developing breathing  (pulmonary) problems (especially infection) following:  A long period of time when you are unable to move or be active. BEFORE THE PROCEDURE   If the spirometer includes an indicator to show your best effort, your nurse or respiratory therapist will set it to a desired goal.  If possible, sit up straight or lean slightly forward. Try not to slouch.  Hold the incentive spirometer in an upright position. INSTRUCTIONS FOR USE  1. Sit on the edge of your bed if possible, or sit up as far as you can in bed or on a chair. 2. Hold the incentive spirometer in an upright position. 3. Breathe out normally. 4. Place the mouthpiece in your mouth and seal your lips tightly around it. 5. Breathe in slowly and as deeply as possible, raising the piston or the ball toward the top of the column. 6. Hold your breath for 3-5 seconds or for as long as possible. Allow the piston or ball to fall to the bottom of the column. 7. Remove the mouthpiece from your mouth and breathe out normally. 8. Rest for a few seconds and repeat Steps 1 through 7 at least 10 times every 1-2 hours when you are awake. Take your time and take a few normal breaths between deep breaths. 9. The spirometer may include an indicator to show your best effort. Use the indicator as a goal to work toward during each repetition. 10. After each set of 10 deep breaths, practice coughing to be sure your lungs are clear. If you have an incision (the cut made at the time of surgery), support your incision when coughing by placing a pillow or rolled up towels firmly against it. Once you are  able to get out of bed, walk around indoors and cough well. You may stop using the incentive spirometer when instructed by your caregiver.  RISKS AND COMPLICATIONS  Take your time so you do not get dizzy or light-headed.  If you are in pain, you may need to take or ask for pain medication before doing incentive spirometry. It is harder to take a deep breath if you  are having pain. AFTER USE  Rest and breathe slowly and easily.  It can be helpful to keep track of a log of your progress. Your caregiver can provide you with a simple table to help with this. If you are using the spirometer at home, follow these instructions: [place] IF:   You are having difficultly using the spirometer.  You have trouble using the spirometer as often as instructed.  Your pain medication is not giving enough relief while using the spirometer.  You develop fever of 100.5 F (38.1 C) or higher. SEEK IMMEDIATE MEDICAL CARE IF:   You cough up bloody sputum that had not been present before.  You develop fever of 102 F (38.9 C) or greater.  You develop worsening pain at or near the incision site. MAKE SURE YOU:   Understand these instructions.  Will watch your condition.  Will get help right away if you are not doing well or get worse. Document Released: 06/25/2006 Document Revised: 05/07/2011 Document Reviewed: 08/26/2006 Exit[employer] Patient Information 2014 [employer], [place].   ________________________________________________________________________  WHAT IS A BLOOD TRANSFUSION? Blood Transfusion Information  A transfusion is the replacement of blood or some of its parts. Blood is made up of multiple cells which provide different functions.  Red blood cells carry oxygen and are used for blood loss replacement.  White blood cells fight against infection.  Platelets control bleeding.  Plasma helps clot blood.  Other blood products are available for specialized needs, such as hemophilia or other clotting disorders. BEFORE THE TRANSFUSION  Who gives blood for transfusions?   Healthy volunteers who are fully evaluated to make sure their blood is safe. This is blood bank blood. Transfusion therapy is the safest it has ever been in the practice of medicine. Before blood is taken from a donor, a complete history is taken to make sure that person has  no history of diseases nor engages in risky social behavior (examples are intravenous drug use or sexual activity with multiple partners). The donor's travel history is screened to minimize risk of transmitting infections, such as malaria. The donated blood is tested for signs of infectious diseases, such as HIV and hepatitis. The blood is then tested to be sure it is compatible with you in order to minimize the chance of a transfusion reaction. If you or a relative donates blood, this is often done in anticipation of surgery and is not appropriate for emergency situations. It takes many days to process the donated blood. RISKS AND COMPLICATIONS Although transfusion therapy is very safe and saves many lives, the main dangers of transfusion include:   Getting an infectious disease.  Developing a transfusion reaction. This is an allergic reaction to something in the blood you were given. Every precaution is taken to prevent this. The decision to have a blood transfusion has been considered carefully by your caregiver before blood is given. Blood is not given unless the benefits outweigh the risks. AFTER THE TRANSFUSION  Right after receiving a blood transfusion, you will usually feel much better and more energetic. This is especially  true if your red blood cells have gotten low (anemic). The transfusion raises the level of the red blood cells which carry oxygen, and this usually causes an energy increase.  The nurse administering the transfusion will monitor you carefully for complications. HOME CARE INSTRUCTIONS  No special instructions are needed after a transfusion. You may find your energy is better. Speak with your caregiver about any limitations on activity for underlying diseases you may have. SEEK MEDICAL CARE IF:   Your condition is not improving after your transfusion.  You develop redness or irritation at the intravenous (IV) site. SEEK IMMEDIATE MEDICAL CARE IF:  Any of the following  symptoms occur over the next 12 hours:  Shaking chills.  You have a temperature by mouth above 102 F (38.9 C), not controlled by medicine.  Chest, back, or muscle pain.  People around you feel you are not acting correctly or are confused.  Shortness of breath or difficulty breathing.  Dizziness and fainting.  You get a rash or develop hives.  You have a decrease in urine output.  Your urine turns a dark color or changes to pink, red, or brown. Any of the following symptoms occur over the next 10 days:  You have a temperature by mouth above 102 F (38.9 C), not controlled by medicine.  Shortness of breath.  Weakness after normal activity.  The white part of the eye turns yellow (jaundice).  You have a decrease in the amount of urine or are urinating less often.  Your urine turns a dark color or changes to pink, red, or brown. Document Released: 02/10/2000 Document Revised: 05/07/2011 Document Reviewed: 09/29/2007 [facility] Patient Information 2014 [place], [place].  _______________________________________________________________________

## 2017-01-22 ENCOUNTER — Encounter (HOSPITAL_COMMUNITY)
Admission: RE | Admit: 2017-01-22 | Discharge: 2017-01-22 | Disposition: A | Discharge status: Home / Self Care | Payer: BLUE CROSS/BLUE SHIELD | Source: Ambulatory Visit | Attending: Orthopedic Surgery | Admitting: Orthopedic Surgery

## 2017-01-22 ENCOUNTER — Other Ambulatory Visit: Payer: Self-pay

## 2017-01-22 ENCOUNTER — Encounter (HOSPITAL_COMMUNITY): Payer: Self-pay

## 2017-01-22 DIAGNOSIS — I959 Hypotension, unspecified: Secondary | ICD-10-CM | POA: Diagnosis not present

## 2017-01-22 DIAGNOSIS — Z471 Aftercare following joint replacement surgery: Secondary | ICD-10-CM | POA: Diagnosis not present

## 2017-01-22 DIAGNOSIS — Z01812 Encounter for preprocedural laboratory examination: Secondary | ICD-10-CM

## 2017-01-22 DIAGNOSIS — Z880 Allergy status to penicillin: Secondary | ICD-10-CM | POA: Diagnosis not present

## 2017-01-22 DIAGNOSIS — Z01810 Encounter for preprocedural cardiovascular examination: Secondary | ICD-10-CM | POA: Insufficient documentation

## 2017-01-22 DIAGNOSIS — Z882 Allergy status to sulfonamides status: Secondary | ICD-10-CM | POA: Diagnosis not present

## 2017-01-22 DIAGNOSIS — I10 Essential (primary) hypertension: Secondary | ICD-10-CM | POA: Diagnosis not present

## 2017-01-22 DIAGNOSIS — M1611 Unilateral primary osteoarthritis, right hip: Secondary | ICD-10-CM

## 2017-01-22 DIAGNOSIS — R001 Bradycardia, unspecified: Secondary | ICD-10-CM

## 2017-01-22 DIAGNOSIS — R9431 Abnormal electrocardiogram [ECG] [EKG]: Secondary | ICD-10-CM

## 2017-01-22 DIAGNOSIS — M47896 Other spondylosis, lumbar region: Secondary | ICD-10-CM | POA: Diagnosis not present

## 2017-01-22 DIAGNOSIS — Z96641 Presence of right artificial hip joint: Secondary | ICD-10-CM | POA: Diagnosis not present

## 2017-01-22 DIAGNOSIS — M25751 Osteophyte, right hip: Secondary | ICD-10-CM | POA: Diagnosis not present

## 2017-01-22 DIAGNOSIS — M85451 Solitary bone cyst, right pelvis: Secondary | ICD-10-CM | POA: Diagnosis not present

## 2017-01-22 DIAGNOSIS — Z888 Allergy status to other drugs, medicaments and biological substances status: Secondary | ICD-10-CM | POA: Diagnosis not present

## 2017-01-22 DIAGNOSIS — Z881 Allergy status to other antibiotic agents status: Secondary | ICD-10-CM | POA: Diagnosis not present

## 2017-01-22 DIAGNOSIS — Z8249 Family history of ischemic heart disease and other diseases of the circulatory system: Secondary | ICD-10-CM | POA: Diagnosis not present

## 2017-01-22 DIAGNOSIS — Z91048 Other nonmedicinal substance allergy status: Secondary | ICD-10-CM | POA: Diagnosis not present

## 2017-01-22 DIAGNOSIS — M1990 Unspecified osteoarthritis, unspecified site: Secondary | ICD-10-CM | POA: Diagnosis not present

## 2017-01-22 DIAGNOSIS — M25551 Pain in right hip: Secondary | ICD-10-CM | POA: Diagnosis not present

## 2017-01-22 DIAGNOSIS — Z885 Allergy status to narcotic agent status: Secondary | ICD-10-CM | POA: Diagnosis not present

## 2017-01-22 DIAGNOSIS — F411 Generalized anxiety disorder: Secondary | ICD-10-CM | POA: Diagnosis not present

## 2017-01-22 DIAGNOSIS — Z85828 Personal history of other malignant neoplasm of skin: Secondary | ICD-10-CM | POA: Diagnosis not present

## 2017-01-22 LAB — BASIC METABOLIC PANEL
ANION GAP: 7 (ref 5–15)
BUN: 25 mg/dL — ABNORMAL HIGH (ref 6–20)
CHLORIDE: 108 mmol/L (ref 101–111)
CO2: 23 mmol/L (ref 22–32)
Calcium: 10.4 mg/dL — ABNORMAL HIGH (ref 8.9–10.3)
Creatinine, Ser: 0.75 mg/dL (ref 0.44–1.00)
GFR calc non Af Amer: >60 mL/min (ref >60)
GLUCOSE: 100 mg/dL — AB (ref 65–99)
Potassium: 4 mmol/L (ref 3.5–5.1)
Sodium: 138 mmol/L (ref 135–145)

## 2017-01-22 LAB — CBC
HEMATOCRIT: 32.6 % — AB (ref 36.0–46.0)
HEMOGLOBIN: 10.8 g/dL — AB (ref 12.0–15.0)
MCH: 33 pg (ref 26.0–34.0)
MCHC: 33.1 g/dL (ref 30.0–36.0)
MCV: 99.7 fL (ref 78.0–100.0)
Platelets: 223 10*3/uL (ref 150–400)
RBC: 3.27 MIL/uL — AB (ref 3.87–5.11)
RDW: 12.8 % (ref 11.5–15.5)
WBC: 7 10*3/uL (ref 4.0–10.5)

## 2017-01-22 LAB — ABO/RH: ABO/RH(D): B POS

## 2017-01-22 LAB — SURGICAL PCR SCREEN
MRSA, PCR: NEGATIVE
Staphylococcus aureus: NEGATIVE

## 2017-01-22 NOTE — Progress Notes (Signed)
Spoke to Dr. Candida Peeling, Anesthesiologist,  regarding pt's EKG result. Per Dr. Sabra Heck, pt is okay to proceed with surgery.

## 2017-01-22 NOTE — Progress Notes (Signed)
BMP result routed to Dr. Lyla Glassing for review.

## 2017-01-24 ENCOUNTER — Other Ambulatory Visit: Payer: Self-pay

## 2017-01-24 ENCOUNTER — Inpatient Hospital Stay (HOSPITAL_COMMUNITY)
Admission: RE | Admit: 2017-01-24 | Discharge: 2017-01-25 | DRG: 470 | Disposition: A | Discharge status: Home / Self Care | Payer: BLUE CROSS/BLUE SHIELD | Source: Ambulatory Visit | Attending: Orthopedic Surgery | Admitting: Orthopedic Surgery

## 2017-01-24 ENCOUNTER — Encounter (HOSPITAL_COMMUNITY): Payer: Self-pay | Admitting: *Deleted

## 2017-01-24 ENCOUNTER — Inpatient Hospital Stay (HOSPITAL_COMMUNITY): Payer: BLUE CROSS/BLUE SHIELD

## 2017-01-24 ENCOUNTER — Inpatient Hospital Stay (HOSPITAL_COMMUNITY): Payer: BLUE CROSS/BLUE SHIELD | Admitting: Registered Nurse

## 2017-01-24 ENCOUNTER — Encounter (HOSPITAL_COMMUNITY)
Admission: RE | Disposition: A | Discharge status: Home / Self Care | Payer: Self-pay | Source: Ambulatory Visit | Attending: Orthopedic Surgery

## 2017-01-24 DIAGNOSIS — I10 Essential (primary) hypertension: Secondary | ICD-10-CM | POA: Diagnosis present

## 2017-01-24 DIAGNOSIS — I959 Hypotension, unspecified: Secondary | ICD-10-CM | POA: Diagnosis not present

## 2017-01-24 DIAGNOSIS — M1990 Unspecified osteoarthritis, unspecified site: Secondary | ICD-10-CM | POA: Diagnosis not present

## 2017-01-24 DIAGNOSIS — Z882 Allergy status to sulfonamides status: Secondary | ICD-10-CM

## 2017-01-24 DIAGNOSIS — Z419 Encounter for procedure for purposes other than remedying health state, unspecified: Secondary | ICD-10-CM

## 2017-01-24 DIAGNOSIS — F411 Generalized anxiety disorder: Secondary | ICD-10-CM | POA: Diagnosis present

## 2017-01-24 DIAGNOSIS — M85451 Solitary bone cyst, right pelvis: Secondary | ICD-10-CM | POA: Diagnosis present

## 2017-01-24 DIAGNOSIS — M1611 Unilateral primary osteoarthritis, right hip: Secondary | ICD-10-CM | POA: Diagnosis not present

## 2017-01-24 DIAGNOSIS — Z96641 Presence of right artificial hip joint: Secondary | ICD-10-CM | POA: Diagnosis not present

## 2017-01-24 DIAGNOSIS — M25551 Pain in right hip: Secondary | ICD-10-CM | POA: Diagnosis present

## 2017-01-24 DIAGNOSIS — Z8249 Family history of ischemic heart disease and other diseases of the circulatory system: Secondary | ICD-10-CM | POA: Diagnosis not present

## 2017-01-24 DIAGNOSIS — Z85828 Personal history of other malignant neoplasm of skin: Secondary | ICD-10-CM | POA: Diagnosis not present

## 2017-01-24 DIAGNOSIS — Z885 Allergy status to narcotic agent status: Secondary | ICD-10-CM | POA: Diagnosis not present

## 2017-01-24 DIAGNOSIS — Z880 Allergy status to penicillin: Secondary | ICD-10-CM

## 2017-01-24 DIAGNOSIS — Z91048 Other nonmedicinal substance allergy status: Secondary | ICD-10-CM

## 2017-01-24 DIAGNOSIS — Z881 Allergy status to other antibiotic agents status: Secondary | ICD-10-CM | POA: Diagnosis not present

## 2017-01-24 DIAGNOSIS — M25751 Osteophyte, right hip: Secondary | ICD-10-CM | POA: Diagnosis present

## 2017-01-24 DIAGNOSIS — M47896 Other spondylosis, lumbar region: Secondary | ICD-10-CM | POA: Diagnosis present

## 2017-01-24 DIAGNOSIS — Z888 Allergy status to other drugs, medicaments and biological substances status: Secondary | ICD-10-CM

## 2017-01-24 DIAGNOSIS — Z471 Aftercare following joint replacement surgery: Secondary | ICD-10-CM | POA: Diagnosis not present

## 2017-01-24 DIAGNOSIS — Z09 Encounter for follow-up examination after completed treatment for conditions other than malignant neoplasm: Secondary | ICD-10-CM

## 2017-01-24 HISTORY — PX: TOTAL HIP ARTHROPLASTY: SHX124

## 2017-01-24 LAB — TYPE AND SCREEN
ABO/RH(D): B POS
Antibody Screen: NEGATIVE

## 2017-01-24 SURGERY — ARTHROPLASTY, HIP, TOTAL, ANTERIOR APPROACH
Anesthesia: Spinal | Site: Hip | Laterality: Right

## 2017-01-24 MED ORDER — LISINOPRIL 20 MG PO TABS: 20.0000 mg | ORAL_TABLET | Freq: Every day | ORAL | Status: DC

## 2017-01-24 MED ORDER — BUPIVACAINE HCL (PF) 0.5 % IJ SOLN
INTRAMUSCULAR | Status: DC | PRN
  Administered 2017-01-24: 3 mL
  Administered 2017-01-24: 222 mL

## 2017-01-24 MED ORDER — DOCUSATE SODIUM 100 MG PO CAPS
100.0000 mg | ORAL_CAPSULE | Freq: Two times a day (BID) | ORAL | Status: DC
  Administered 2017-01-24 – 2017-01-25 (×2): 100 mg via ORAL
  Filled 2017-01-24 (×2): qty 1

## 2017-01-24 MED ORDER — SODIUM CHLORIDE 0.9 % IV SOLN
INTRAVENOUS | Status: DC
  Administered 2017-01-24: 17:00:00 via INTRAVENOUS

## 2017-01-24 MED ORDER — BUPIVACAINE-EPINEPHRINE 0.25% -1:200000 IJ SOLN
INTRAMUSCULAR | Status: DC | PRN
Start: 2017-01-24 — End: 2017-01-24
  Administered 2017-01-24: 30 mL

## 2017-01-24 MED ORDER — KETOROLAC TROMETHAMINE 15 MG/ML IJ SOLN
15.0000 mg | Freq: Four times a day (QID) | INTRAMUSCULAR | Status: DC
  Administered 2017-01-24: 15 mg via INTRAVENOUS
  Filled 2017-01-24: qty 1

## 2017-01-24 MED ORDER — FENTANYL CITRATE (PF) 100 MCG/2ML IJ SOLN
INTRAMUSCULAR | Status: DC | PRN
  Administered 2017-01-24 (×2): 50 ug via INTRAVENOUS

## 2017-01-24 MED ORDER — HYDROCODONE-ACETAMINOPHEN 5-325 MG PO TABS: 2.0000 | ORAL_TABLET | ORAL | Status: DC | PRN

## 2017-01-24 MED ORDER — ISOPROPYL ALCOHOL 70 % SOLN
Status: AC
  Filled 2017-01-24: qty 480

## 2017-01-24 MED ORDER — TRANEXAMIC ACID 1000 MG/10ML IV SOLN
1000.0000 mg | Freq: Once | INTRAVENOUS | Status: AC
  Administered 2017-01-24: 1000 mg via INTRAVENOUS
  Filled 2017-01-24: qty 1100

## 2017-01-24 MED ORDER — DIPHENHYDRAMINE HCL 12.5 MG/5ML PO ELIX: 12.5000 mg | ORAL_SOLUTION | ORAL | Status: DC | PRN

## 2017-01-24 MED ORDER — MENTHOL 3 MG MT LOZG: 1.0000 | LOZENGE | OROMUCOSAL | Status: DC | PRN

## 2017-01-24 MED ORDER — POVIDONE-IODINE 10 % EX SWAB
2.0000 "application " | Freq: Once | CUTANEOUS | Status: AC
  Administered 2017-01-24: 2 via TOPICAL

## 2017-01-24 MED ORDER — METHOCARBAMOL 1000 MG/10ML IJ SOLN
500.0000 mg | Freq: Four times a day (QID) | INTRAVENOUS | Status: DC | PRN
  Administered 2017-01-24: 500 mg via INTRAVENOUS
  Filled 2017-01-24: qty 550

## 2017-01-24 MED ORDER — DEXAMETHASONE SODIUM PHOSPHATE 10 MG/ML IJ SOLN
10.0000 mg | Freq: Once | INTRAMUSCULAR | Status: AC
  Administered 2017-01-25: 10 mg via INTRAVENOUS
  Filled 2017-01-24: qty 1

## 2017-01-24 MED ORDER — SODIUM CHLORIDE 0.9 % IJ SOLN
INTRAMUSCULAR | Status: AC
  Filled 2017-01-24: qty 50

## 2017-01-24 MED ORDER — PROPRANOLOL HCL 20 MG PO TABS
20.0000 mg | ORAL_TABLET | Freq: Every day | ORAL | Status: DC
  Filled 2017-01-24: qty 1

## 2017-01-24 MED ORDER — ASPIRIN 81 MG PO CHEW
81.0000 mg | CHEWABLE_TABLET | Freq: Two times a day (BID) | ORAL | Status: DC
  Administered 2017-01-24 – 2017-01-25 (×2): 81 mg via ORAL
  Filled 2017-01-24 (×2): qty 1

## 2017-01-24 MED ORDER — METOCLOPRAMIDE HCL 5 MG/ML IJ SOLN: 5.0000 mg | Freq: Three times a day (TID) | INTRAMUSCULAR | Status: DC | PRN

## 2017-01-24 MED ORDER — MIDAZOLAM HCL 5 MG/5ML IJ SOLN
INTRAMUSCULAR | Status: DC | PRN
  Administered 2017-01-24: 2 mg via INTRAVENOUS

## 2017-01-24 MED ORDER — PHENYLEPHRINE HCL 10 MG/ML IJ SOLN
INTRAMUSCULAR | Status: AC
  Filled 2017-01-24: qty 1

## 2017-01-24 MED ORDER — CHLORHEXIDINE GLUCONATE 4 % EX LIQD: 60.0000 mL | Freq: Once | CUTANEOUS | Status: DC

## 2017-01-24 MED ORDER — KETOROLAC TROMETHAMINE 30 MG/ML IJ SOLN
INTRAMUSCULAR | Status: DC | PRN
  Administered 2017-01-24: 30 mg via INTRA_ARTICULAR

## 2017-01-24 MED ORDER — PROPOFOL 10 MG/ML IV BOLUS
INTRAVENOUS | Status: DC | PRN
  Administered 2017-01-24: 30 mg via INTRAVENOUS
  Administered 2017-01-24: 20 mg via INTRAVENOUS

## 2017-01-24 MED ORDER — MIDAZOLAM HCL 2 MG/2ML IJ SOLN
INTRAMUSCULAR | Status: AC
  Filled 2017-01-24: qty 2

## 2017-01-24 MED ORDER — CLINDAMYCIN PHOSPHATE 900 MG/50ML IV SOLN
900.0000 mg | INTRAVENOUS | Status: AC
  Administered 2017-01-24: 900 mg via INTRAVENOUS
  Filled 2017-01-24: qty 50

## 2017-01-24 MED ORDER — ONDANSETRON HCL 4 MG PO TABS: 4.0000 mg | ORAL_TABLET | Freq: Four times a day (QID) | ORAL | Status: DC | PRN

## 2017-01-24 MED ORDER — ONDANSETRON HCL 4 MG/2ML IJ SOLN
INTRAMUSCULAR | Status: DC | PRN
  Administered 2017-01-24: 4 mg via INTRAVENOUS

## 2017-01-24 MED ORDER — POLYETHYLENE GLYCOL 3350 17 G PO PACK: 17.0000 g | PACK | Freq: Every day | ORAL | Status: DC | PRN

## 2017-01-24 MED ORDER — SODIUM CHLORIDE 0.9 % IR SOLN
Status: DC | PRN
  Administered 2017-01-24: 1000 mL

## 2017-01-24 MED ORDER — PHENOL 1.4 % MT LIQD: 1.0000 | OROMUCOSAL | Status: DC | PRN

## 2017-01-24 MED ORDER — SENNA 8.6 MG PO TABS
2.0000 | ORAL_TABLET | Freq: Every day | ORAL | Status: DC
  Administered 2017-01-24: 17.2 mg via ORAL
  Filled 2017-01-24: qty 2

## 2017-01-24 MED ORDER — ISOPROPYL ALCOHOL 70 % SOLN
Status: DC | PRN
  Administered 2017-01-24: 1 via TOPICAL

## 2017-01-24 MED ORDER — FENTANYL CITRATE (PF) 100 MCG/2ML IJ SOLN: 25.0000 ug | INTRAMUSCULAR | Status: DC | PRN

## 2017-01-24 MED ORDER — ACETAMINOPHEN 650 MG RE SUPP: 650.0000 mg | RECTAL | Status: DC | PRN

## 2017-01-24 MED ORDER — ACETAMINOPHEN 10 MG/ML IV SOLN
1000.0000 mg | INTRAVENOUS | Status: AC
  Administered 2017-01-24: 1000 mg via INTRAVENOUS
  Filled 2017-01-24: qty 100

## 2017-01-24 MED ORDER — SODIUM CHLORIDE 0.9 % IJ SOLN
INTRAMUSCULAR | Status: DC | PRN
  Administered 2017-01-24: 29 mL

## 2017-01-24 MED ORDER — ONDANSETRON HCL 4 MG/2ML IJ SOLN
INTRAMUSCULAR | Status: AC
  Filled 2017-01-24: qty 2

## 2017-01-24 MED ORDER — DEXAMETHASONE SODIUM PHOSPHATE 10 MG/ML IJ SOLN
INTRAMUSCULAR | Status: DC | PRN
  Administered 2017-01-24: 10 mg via INTRAVENOUS

## 2017-01-24 MED ORDER — KETOROLAC TROMETHAMINE 30 MG/ML IJ SOLN
INTRAMUSCULAR | Status: AC
  Filled 2017-01-24: qty 1

## 2017-01-24 MED ORDER — HYDROMORPHONE HCL 1 MG/ML IJ SOLN: 0.5000 mg | INTRAMUSCULAR | Status: DC | PRN

## 2017-01-24 MED ORDER — LACTATED RINGERS IV SOLN
INTRAVENOUS | Status: DC
  Administered 2017-01-24 (×4): via INTRAVENOUS

## 2017-01-24 MED ORDER — SODIUM CHLORIDE 0.9 % IV SOLN
1000.0000 mg | INTRAVENOUS | Status: AC
  Administered 2017-01-24: 1000 mg via INTRAVENOUS
  Filled 2017-01-24: qty 1100

## 2017-01-24 MED ORDER — CLINDAMYCIN PHOSPHATE 600 MG/50ML IV SOLN
600.0000 mg | Freq: Four times a day (QID) | INTRAVENOUS | Status: AC
  Administered 2017-01-24 – 2017-01-25 (×2): 600 mg via INTRAVENOUS
  Filled 2017-01-24 (×2): qty 50

## 2017-01-24 MED ORDER — ONDANSETRON HCL 4 MG/2ML IJ SOLN: 4.0000 mg | Freq: Four times a day (QID) | INTRAMUSCULAR | Status: DC | PRN

## 2017-01-24 MED ORDER — PROPOFOL 500 MG/50ML IV EMUL
INTRAVENOUS | Status: DC | PRN
  Administered 2017-01-24: 75 ug/kg/min via INTRAVENOUS

## 2017-01-24 MED ORDER — DEXAMETHASONE SODIUM PHOSPHATE 10 MG/ML IJ SOLN
INTRAMUSCULAR | Status: AC
  Filled 2017-01-24: qty 1

## 2017-01-24 MED ORDER — PHENYLEPHRINE HCL 10 MG/ML IJ SOLN
INTRAMUSCULAR | Status: DC | PRN
  Administered 2017-01-24 (×2): 80 ug via INTRAVENOUS

## 2017-01-24 MED ORDER — ALPRAZOLAM 0.5 MG PO TABS
0.5000 mg | ORAL_TABLET | Freq: Two times a day (BID) | ORAL | Status: DC | PRN
  Administered 2017-01-24: 0.5 mg via ORAL
  Filled 2017-01-24: qty 1

## 2017-01-24 MED ORDER — FENTANYL CITRATE (PF) 250 MCG/5ML IJ SOLN
INTRAMUSCULAR | Status: AC
  Filled 2017-01-24: qty 5

## 2017-01-24 MED ORDER — PHENYLEPHRINE HCL 10 MG/ML IJ SOLN
INTRAMUSCULAR | Status: DC | PRN
  Administered 2017-01-24: 50 ug/min via INTRAVENOUS
  Administered 2017-01-24: 17:00:00

## 2017-01-24 MED ORDER — PROPOFOL 10 MG/ML IV BOLUS
INTRAVENOUS | Status: AC
  Filled 2017-01-24: qty 60

## 2017-01-24 MED ORDER — SODIUM CHLORIDE 0.9 % IV SOLN
INTRAVENOUS | Status: DC
Start: 2017-01-24 — End: 2017-01-24

## 2017-01-24 MED ORDER — METOCLOPRAMIDE HCL 5 MG PO TABS: 5.0000 mg | ORAL_TABLET | Freq: Three times a day (TID) | ORAL | Status: DC | PRN

## 2017-01-24 MED ORDER — METHOCARBAMOL 500 MG PO TABS: 500.0000 mg | ORAL_TABLET | Freq: Four times a day (QID) | ORAL | Status: DC | PRN

## 2017-01-24 MED ORDER — ALUM & MAG HYDROXIDE-SIMETH 200-200-20 MG/5ML PO SUSP: 30.0000 mL | ORAL | Status: DC | PRN

## 2017-01-24 MED ORDER — BUPIVACAINE-EPINEPHRINE (PF) 0.25% -1:200000 IJ SOLN
INTRAMUSCULAR | Status: AC
  Filled 2017-01-24: qty 30

## 2017-01-24 MED ORDER — ACETAMINOPHEN 325 MG PO TABS: 650.0000 mg | ORAL_TABLET | ORAL | Status: DC | PRN

## 2017-01-24 MED ORDER — PHENYLEPHRINE 40 MCG/ML (10ML) SYRINGE FOR IV PUSH (FOR BLOOD PRESSURE SUPPORT)
PREFILLED_SYRINGE | INTRAVENOUS | Status: AC
  Filled 2017-01-24: qty 10

## 2017-01-24 MED ORDER — HYDROCODONE-ACETAMINOPHEN 5-325 MG PO TABS
1.0000 | ORAL_TABLET | ORAL | Status: DC | PRN
  Administered 2017-01-24 – 2017-01-25 (×2): 1 via ORAL
  Filled 2017-01-24 (×2): qty 1

## 2017-01-24 SURGICAL SUPPLY — 42 items
BAG DECANTER FOR FLEXI CONT (MISCELLANEOUS) IMPLANT
BAG ZIPLOCK 12X15 (MISCELLANEOUS) IMPLANT
CAPT HIP TOTAL 2 ×2 IMPLANT
CHLORAPREP W/TINT 26ML (MISCELLANEOUS) ×2 IMPLANT
CLOTH BEACON ORANGE TIMEOUT ST (SAFETY) ×2 IMPLANT
COVER PERINEAL POST (MISCELLANEOUS) ×2 IMPLANT
COVER SURGICAL LIGHT HANDLE (MISCELLANEOUS) ×2 IMPLANT
DECANTER SPIKE VIAL GLASS SM (MISCELLANEOUS) ×2 IMPLANT
DERMABOND ADVANCED (GAUZE/BANDAGES/DRESSINGS) ×2
DERMABOND ADVANCED .7 DNX12 (GAUZE/BANDAGES/DRESSINGS) ×2 IMPLANT
DRAPE SHEET LG 3/4 BI-LAMINATE (DRAPES) ×6 IMPLANT
DRAPE STERI IOBAN 125X83 (DRAPES) ×2 IMPLANT
DRAPE U-SHAPE 47X51 STRL (DRAPES) ×4 IMPLANT
DRSG AQUACEL AG ADV 3.5X10 (GAUZE/BANDAGES/DRESSINGS) ×2 IMPLANT
ELECT PENCIL ROCKER SW 15FT (MISCELLANEOUS) ×2 IMPLANT
ELECT REM PT RETURN 15FT ADLT (MISCELLANEOUS) ×2 IMPLANT
GAUZE SPONGE 4X4 12PLY STRL (GAUZE/BANDAGES/DRESSINGS) ×2 IMPLANT
GLOVE BIO SURGEON STRL SZ8.5 (GLOVE) ×20 IMPLANT
GLOVE BIOGEL PI IND STRL 8.5 (GLOVE) ×1 IMPLANT
GLOVE BIOGEL PI INDICATOR 8.5 (GLOVE) ×1
GOWN SPEC L3 XXLG W/TWL (GOWN DISPOSABLE) ×2 IMPLANT
HANDPIECE INTERPULSE COAX TIP (DISPOSABLE) ×1
HOLDER FOLEY CATH W/STRAP (MISCELLANEOUS) ×2 IMPLANT
HOOD PEEL AWAY FLYTE STAYCOOL (MISCELLANEOUS) ×6 IMPLANT
MARKER SKIN DUAL TIP RULER LAB (MISCELLANEOUS) ×2 IMPLANT
NEEDLE SPNL 18GX3.5 QUINCKE PK (NEEDLE) ×2 IMPLANT
PACK ANTERIOR HIP CUSTOM (KITS) ×2 IMPLANT
SAW OSC TIP CART 19.5X105X1.3 (SAW) ×2 IMPLANT
SEALER BIPOLAR AQUA 6.0 (INSTRUMENTS) ×2 IMPLANT
SET HNDPC FAN SPRY TIP SCT (DISPOSABLE) ×1 IMPLANT
SUT ETHIBOND NAB CT1 #1 30IN (SUTURE) ×4 IMPLANT
SUT MNCRL AB 3-0 PS2 18 (SUTURE) ×2 IMPLANT
SUT MON AB 2-0 CT1 36 (SUTURE) ×6 IMPLANT
SUT STRATAFIX PDO 1 14 VIOLET (SUTURE) ×1
SUT STRATFX PDO 1 14 VIOLET (SUTURE) ×1
SUT VIC AB 2-0 CT1 27 (SUTURE) ×1
SUT VIC AB 2-0 CT1 TAPERPNT 27 (SUTURE) ×1 IMPLANT
SUTURE STRATFX PDO 1 14 VIOLET (SUTURE) ×1 IMPLANT
SYR 50ML LL SCALE MARK (SYRINGE) ×2 IMPLANT
TRAY FOLEY W/METER SILVER 16FR (SET/KITS/TRAYS/PACK) IMPLANT
WATER STERILE IRR 1000ML POUR (IV SOLUTION) ×2 IMPLANT
YANKAUER SUCT BULB TIP 10FT TU (MISCELLANEOUS) ×2 IMPLANT

## 2017-01-24 NOTE — Anesthesia Procedure Notes (Signed)
Performed by: Axle Parfait E, CRNA       

## 2017-01-24 NOTE — Op Note (Addendum)
OPERATIVE REPORT  SURGEON: Rod Can, MD   ASSISTANT: Nehemiah Massed, PA-C.  PREOPERATIVE DIAGNOSIS: Right hip arthritis.   POSTOPERATIVE DIAGNOSIS: Right hip arthritis.   PROCEDURE: Right total hip arthroplasty, anterior approach.   IMPLANTS: DePuy Tri Lock stem, size 4, hi offset. DePuy Pinnacle Cup, size 52 mm. DePuy Altrx liner, size 32 by 52 mm, neutral. DePuy Biolox ceramic head ball, size 32 + 1 mm.  ANESTHESIA:  Spinal  ESTIMATED BLOOD LOSS:-250 mL    ANTIBIOTICS: 900 mg clindamycin (PCN allergy).  DRAINS: None.  COMPLICATIONS: None.   CONDITION: PACU - hemodynamically stable.   BRIEF CLINICAL NOTE: Dawn Casey is a 61 y.o. female with a long-standing history of Right hip arthritis. After failing conservative management, the patient was indicated for total hip arthroplasty. The risks, benefits, and alternatives to the procedure were explained, and the patient elected to proceed.  PROCEDURE IN DETAIL: Surgical site was marked by myself in the pre-op holding area. Once inside the operating room, spinal anesthesia was obtained, and a foley catheter was inserted. The patient was then positioned on the [place] table. All bony prominences were well padded. The hip was prepped and draped in the normal sterile surgical fashion. A time-out was called verifying side and site of surgery. The patient received IV antibiotics within 60 minutes of beginning the procedure.  The direct anterior approach to the hip was performed through the Hueter interval. Lateral femoral circumflex vessels were treated with the Auqumantys. The anterior capsule was exposed and an inverted T capsulotomy was made.The femoral neck cut was made to the level of the templated cut. A corkscrew was placed into the head and the head was removed. The femoral head was found to have eburnated bone.  The head was passed to the back table and was measured.  Acetabular exposure was achieved, and the pulvinar and labrum were excised. Sequential reaming of the acetabulum was then performed up to a size 51 mm reamer.  She had a cyst in the superior/lateral acetabulum that I curetted to a stable bony base.  I packed the cyst with her native cancellus bone from the femoral head.  A 50 mm reamer on reverse was used to the graft.  A 52 mm cup was then opened and impacted into place at approximately 40 degrees of abduction and 20 degrees of anteversion. The final polyethylene liner was impacted into place and acetabular osteophytes were removed.   I then gained femoral exposure taking care to protect the abductors and greater trochanter. This was performed using standard external rotation, extension, and adduction. The capsule was peeled off the inner aspect of the greater trochanter, taking care to preserve the short external rotators. A cookie cutter was used to enter the femoral canal, and then the femoral canal finder was placed. Sequential broaching was performed up to a size 4. Calcar planer was used on the femoral neck remnant. I placed a hi offset neck and a trial head ball. The hip was reduced. Leg lengths and offset were checked fluoroscopically. Lengthening of a few millimeters was required to provide adequate soft tissue stability.  The hip was dislocated and trial components were removed. The final implants were placed, and the hip was reduced.  Fluoroscopy was used to confirm component position and leg lengths. At 90 degrees of external rotation and full extension, the hip was stable to an anterior directed force.  The wound was copiously irrigated with normal saline using pulse lavage. Marcaine solution was injected into the  periarticular soft tissue. The wound was closed in layers using #1 Vicryl and V-Loc for the fascia, 2-0 Vicryl for the subcutaneous fat, 2-0 Monocryl for the deep  dermal layer, 3-0 running Monocryl subcuticular stitch, and Dermabond for the skin. Once the glue was fully dried, an Aquacell Ag dressing was applied. The patient was transported to the recovery room in stable condition. Sponge, needle, and instrument counts were correct at the end of the case x2. The patient tolerated the procedure well and there were no known complications.  Please note that a surgical assistant was a medical necessity for this procedure to perform it in a safe and expeditious manner. Assistant was necessary to provide appropriate retraction of vital neurovascular structures, to prevent femoral fracture, and to allow for anatomic placement of the prosthesis.

## 2017-01-24 NOTE — Anesthesia Preprocedure Evaluation (Signed)
Anesthesia Evaluation  Patient identified by MRN, date of birth, ID band Patient awake    Reviewed: Allergy & Precautions, H&P , Patient's Chart, lab work & pertinent test results  Airway Mallampati: II  TM Distance: >3 FB Neck ROM: full    Dental no notable dental hx.    Pulmonary    Pulmonary exam normal breath sounds clear to auscultation       Cardiovascular Exercise Tolerance: Good hypertension,  Rhythm:regular Rate:Normal     Neuro/Psych    GI/Hepatic   Endo/Other    Renal/GU      Musculoskeletal   Abdominal   Peds  Hematology   Anesthesia Other Findings   Reproductive/Obstetrics                             Anesthesia Physical Anesthesia Plan  ASA: II  Anesthesia Plan: Spinal   Post-op Pain Management:    Induction:   PONV Risk Score and Plan: 2 and Dexamethasone, Ondansetron and Treatment may vary due to age or medical condition  Airway Management Planned:   Additional Equipment:   Intra-op Plan:   Post-operative Plan:   Informed Consent: I have reviewed the patients History and Physical, chart, labs and discussed the procedure including the risks, benefits and alternatives for the proposed anesthesia with the patient or authorized representative who has indicated his/her understanding and acceptance.     Plan Discussed with:   Anesthesia Plan Comments: (  )        Anesthesia Quick Evaluation

## 2017-01-24 NOTE — H&P (Signed)
TOTAL HIP ADMISSION H&P  Patient is admitted for right total hip arthroplasty.  Subjective:  Chief Complaint: right hip pain  HPI: Dawn Casey, 61 y.o. female, has a history of pain and functional disability in the right hip(s) due to arthritis and patient has failed non-surgical conservative treatments for greater than 12 weeks to include NSAID's and/or analgesics, corticosteriod injections, flexibility and strengthening excercises, supervised PT with diminished ADL's post treatment, use of assistive devices and activity modification.  Onset of symptoms was gradual starting 2 years ago with gradually worsening course since that time.The patient noted no past surgery on the right hip(s).  Patient currently rates pain in the right hip at 10 out of 10 with activity. Patient has night pain, worsening of pain with activity and weight bearing, pain that interfers with activities of daily living, pain with passive range of motion and crepitus. Patient has evidence of subchondral cysts, subchondral sclerosis, periarticular osteophytes and joint space narrowing by imaging studies. This condition presents safety issues increasing the risk of falls.   There is no current active infection.  Patient Active Problem List   Diagnosis Date Noted  . History of alcohol abuse 03/20/2016  . Other depressive episodes 03/20/2016  . Degenerative joint disease 03/20/2016  . Osteoarthritis of right hip 01/24/2016  . Generalized anxiety disorder 01/16/2014  . Anemia 01/16/2014  . Essential hypertension 01/16/2014   Past Medical History:  Diagnosis Date  . Allergy   . Anxiety   . Degenerative joint disease (DJD) of lumbar spine   . Hip arthritis    Per rads.   . Hypertension   . Skin cancer 08/12/2015   Per patient's report.     Past Surgical History:  Procedure Laterality Date  . APPENDECTOMY    . BASAL CELL CARCINOMA EXCISION  08/17/2015   From face    Current Facility-Administered Medications   Medication Dose Route Frequency Provider Last Rate Last Dose  . 0.9 %  sodium chloride infusion   Intravenous Continuous Dejanique Ruehl, Aaron Edelman, MD      . acetaminophen (OFIRMEV) IV 1,000 mg  1,000 mg Intravenous To OR Kristel Durkee, Aaron Edelman, MD      . chlorhexidine (HIBICLENS) 4 % liquid 4 application  60 mL Topical Once Kairah Leoni, Aaron Edelman, MD      . chlorhexidine (HIBICLENS) 4 % liquid 4 application  60 mL Topical Once Rossi Silvestro, Aaron Edelman, MD      . clindamycin (CLEOCIN) IVPB 900 mg  900 mg Intravenous On Call to [place], MD      . lactated ringers infusion   Intravenous Continuous Lyndle Herrlich, MD 75 mL/hr at 01/24/17 1100    . tranexamic acid (CYKLOKAPRON) 1,000 mg in sodium chloride 0.9 % 100 mL IVPB  1,000 mg Intravenous To OR Muadh Creasy, Aaron Edelman, MD       Allergies  Allergen Reactions  . Tramadol     Causes vomiting and bleeding esophageal  . Prozac [Fluoxetine Hcl] Hives  . Adhesive [Tape] Rash  . Amoxicillin Rash    Has patient had a PCN reaction causing immediate rash, facial/tongue/throat swelling, SOB or lightheadedness with hypotension: Unknown Has patient had a PCN reaction causing severe rash involving mucus membranes or skin necrosis: Unknown Has patient had a PCN reaction that required hospitalization: Unknown Has patient had a PCN reaction occurring within the last 10 years: No If all of the above answers are "NO", then may proceed with Cephalosporin use.   . Erythromycin Rash  . Sulfa Antibiotics Rash  Social History   Tobacco Use  . Smoking status: Never Smoker  . Smokeless tobacco: Never Used  Substance Use Topics  . Alcohol use: Yes    Alcohol/week: 3.6 oz    Types: 6 Standard drinks or equivalent per week    Comment: Socially    Family History  Problem Relation Age of Onset  . Hypertension Mother   . Diabetes Father   . Hypertension Father      Review of Systems  Constitutional: Negative.   HENT: Negative.   Eyes: Negative.   Respiratory: Negative.    Cardiovascular: Negative.   Gastrointestinal: Negative.   Genitourinary: Negative.   Musculoskeletal: Positive for joint pain.  Skin: Negative.   Neurological: Negative.   Endo/Heme/Allergies: Negative.   Psychiatric/Behavioral: Negative.     Objective:  Physical Exam  Vitals reviewed. Constitutional: She is oriented to person, place, and time. She appears well-developed and well-nourished.  HENT:  Head: Normocephalic and atraumatic.  Eyes: Conjunctivae and EOM are normal. Pupils are equal, round, and reactive to light.  Neck: Normal range of motion. Neck supple.  Cardiovascular: Normal rate, regular rhythm and intact distal pulses.  Respiratory: Effort normal. No respiratory distress.  GI: Soft. She exhibits no distension.  Genitourinary:  Genitourinary Comments: deferred  Musculoskeletal:       Right hip: She exhibits decreased range of motion, decreased strength and bony tenderness.  Neurological: She is alert and oriented to person, place, and time. She has normal reflexes.  Skin: Skin is warm and dry.  Psychiatric: She has a normal mood and affect. Her behavior is normal. Judgment and thought content normal.    Vital signs in last 24 hours: Temp:  [97.7 F (36.5 C)] 97.7 F (36.5 C) (11/29 1023) Pulse Rate:  [66] 66 (11/29 1023) Resp:  [16] 16 (11/29 1023) BP: (111)/(75) 111/75 (11/29 1023) SpO2:  [100 %] 100 % (11/29 1023) Weight:  [54.9 kg (121 lb)] 54.9 kg (121 lb) (11/29 1056)  Labs:   Estimated body mass index is 20.77 kg/m as calculated from the following:   Height as of this encounter: 5\' 4"  (1.626 m).   Weight as of this encounter: 54.9 kg (121 lb).   Imaging Review Plain radiographs demonstrate severe degenerative joint disease of the right hip(s). The bone quality appears to be adequate for age and reported activity level.  Assessment/Plan:  End stage arthritis, right hip(s)  The patient history, physical examination, clinical judgement of the  provider and imaging studies are consistent with end stage degenerative joint disease of the right hip(s) and total hip arthroplasty is deemed medically necessary. The treatment options including medical management, injection therapy, arthroscopy and arthroplasty were discussed at length. The risks and benefits of total hip arthroplasty were presented and reviewed. The risks due to aseptic loosening, infection, stiffness, dislocation/subluxation,  thromboembolic complications and other imponderables were discussed.  The patient acknowledged the explanation, agreed to proceed with the plan and consent was signed. Patient is being admitted for inpatient treatment for surgery, pain control, PT, OT, prophylactic antibiotics, VTE prophylaxis, progressive ambulation and ADL's and discharge planning.The patient is planning to be discharged home with HEP.

## 2017-01-24 NOTE — Transfer of Care (Signed)
Immediate Anesthesia Transfer of Care Note  Patient: Dawn Casey  Procedure(s) Performed: RIGHT TOTAL HIP ARTHROPLASTY ANTERIOR APPROACH (Right Hip)  Patient Location: PACU  Anesthesia Type:Spinal  Level of Consciousness: awake, alert , oriented and patient cooperative  Airway & Oxygen Therapy: Patient Spontanous Breathing and Patient connected to face mask oxygen  Post-op Assessment: Report given to RN and Post -op Vital signs reviewed and stable  Post vital signs: stable  Last Vitals:  Vitals:   01/24/17 1023  BP: 111/75  Pulse: 66  Resp: 16  Temp: 36.5 C  SpO2: 100%    Last Pain:  Vitals:   01/24/17 1023  TempSrc: Oral         Complications: No apparent anesthesia complications  T 12 spinal level

## 2017-01-24 NOTE — Anesthesia Procedure Notes (Signed)
Spinal  Patient location during procedure: OR End time: 01/24/2017 1:52 PM Staffing Resident/CRNA: Lissa Morales, CRNA Performed: anesthesiologist and resident/CRNA  Preanesthetic Checklist Completed: patient identified, site marked, surgical consent, pre-op evaluation, timeout performed, IV checked, risks and benefits discussed and monitors and equipment checked Spinal Block Patient position: sitting Prep: Betadine and ChloraPrep Patient monitoring: heart rate, continuous pulse ox and blood pressure Approach: midline Location: L3-4 Injection technique: single-shot Needle Needle type: Pencan  Needle gauge: 24 G Needle length: 9 cm Assessment Sensory level: T4 Additional Notes Expiration date of kit checked and confirmed. Patient tolerated procedure well, without complications.CSF x3

## 2017-01-24 NOTE — Progress Notes (Signed)
   01/24/17 1200  Clinical Encounter Type  Visited With Patient  Visit Type Initial;Psychological support;Spiritual support;Pre-op  Referral From Nurse  Consult/Referral To Chaplain  Spiritual Encounters  Spiritual Needs Other (Comment) (Advance Directive )  Stress Factors  Patient Stress Factors Financial concerns;Other (Comment) (Advance Directive )  Advance Directives (For Healthcare)  Does Patient Have a Medical Advance Directive? No  Does patient want to make changes to medical advance directive? Yes (Inpatient - patient requests chaplain consult to change a medical advance directive)  Would patient like information on creating a medical advance directive? (Spoke with patient about document )   I visited with the patient per referral from the unit secretary for an Forensic scientist. The patient was upset that she couldn't get her durable power of attorney paperwork notarized here at the hospital. The patient was very anxious over not being able to have the documents done before surgery.  I was able to calm her anxieties.   Please, contact Spiritual Care for further assistance.   [place] M.Div.

## 2017-01-24 NOTE — Discharge Instructions (Signed)
°Dr. Daphney Hopke °Joint Replacement Specialist °[facility] °[address] °[place], [place] [address] °[phone] ° ° °TOTAL HIP REPLACEMENT POSTOPERATIVE DIRECTIONS ° ° ° °Hip Rehabilitation, Guidelines Following Surgery  ° °WEIGHT BEARING °Weight bearing as tolerated with assist device (Scali, cane, etc) as directed, use it as long as suggested by your surgeon or therapist, typically at least 4-6 weeks. ° °The results of a hip operation are greatly improved after range of motion and muscle strengthening exercises. Follow all safety measures which are given to protect your hip. If any of these exercises cause increased pain or swelling in your joint, decrease the amount until you are comfortable again. Then slowly increase the exercises. Call your caregiver if you have problems or questions.  ° °HOME CARE INSTRUCTIONS  °Most of the following instructions are designed to prevent the dislocation of your new hip.  °Remove items at home which could result in a fall. This includes throw rugs or furniture in walking pathways.  °Continue medications as instructed at time of discharge. °· You may have some home medications which will be placed on hold until you complete the course of blood thinner medication. °· You may start showering once you are discharged home. Do not remove your dressing. °Do not put on socks or shoes without following the instructions of your caregivers.   °Sit on chairs with arms. Use the chair arms to help push yourself up when arising.  °Arrange for the use of a toilet seat elevator so you are not sitting low.  °· Walk with Matters as instructed.  °You may resume a sexual relationship in one month or when given the OK by your caregiver.  °Use Thone as long as suggested by your caregivers.  °You may put full weight on your legs and walk as much as is comfortable. °Avoid periods of inactivity such as sitting longer than an hour when not asleep. This helps prevent  blood clots.  °You may return to work once you are cleared by your surgeon.  °Do not drive a car for 6 weeks or until released by your surgeon.  °Do not drive while taking narcotics.  °Wear elastic stockings for two weeks following surgery during the day but you may remove then at night.  °Make sure you keep all of your appointments after your operation with all of your doctors and caregivers. You should call the office at the above phone number and make an appointment for approximately two weeks after the date of your surgery. °Please pick up a stool softener and laxative for home use as long as you are requiring pain medications. °· ICE to the affected hip every three hours for 30 minutes at a time and then as needed for pain and swelling. Continue to use ice on the hip for pain and swelling from surgery. You may notice swelling that will progress down to the foot and ankle.  This is normal after surgery.  Elevate the leg when you are not up walking on it.   °It is important for you to complete the blood thinner medication as prescribed by your doctor. °· Continue to use the breathing machine which will help keep your temperature down.  It is common for your temperature to cycle up and down following surgery, especially at night when you are not up moving around and exerting yourself.  The breathing machine keeps your lungs expanded and your temperature down. ° °RANGE OF MOTION AND STRENGTHENING EXERCISES  °These exercises are   designed to help you keep full movement of your hip joint. Follow your caregiver's or physical therapist's instructions. Perform all exercises about fifteen times, three times per day or as directed. Exercise both hips, even if you have had only one joint replacement. These exercises can be done on a training (exercise) mat, on the floor, on a table or on a bed. Use whatever works the best and is most comfortable for you. Use music or television while you are exercising so that the exercises  are a pleasant break in your day. This will make your life better with the exercises acting as a break in routine you can look forward to.  °Lying on your back, slowly slide your foot toward your buttocks, raising your knee up off the floor. Then slowly slide your foot back down until your leg is straight again.  °Lying on your back spread your legs as far apart as you can without causing discomfort.  °Lying on your side, raise your upper leg and foot straight up from the floor as far as is comfortable. Slowly lower the leg and repeat.  °Lying on your back, tighten up the muscle in the front of your thigh (quadriceps muscles). You can do this by keeping your leg straight and trying to raise your heel off the floor. This helps strengthen the largest muscle supporting your knee.  °Lying on your back, tighten up the muscles of your buttocks both with the legs straight and with the knee bent at a comfortable angle while keeping your heel on the floor.  ° °SKILLED REHAB INSTRUCTIONS: °If the patient is transferred to a skilled rehab facility following release from the hospital, a list of the current medications will be sent to the facility for the patient to continue.  When discharged from the skilled rehab facility, please have the facility set up the patient's Home Health Physical Therapy prior to being released. Also, the skilled facility will be responsible for providing the patient with their medications at time of release from the facility to include their pain medication and their blood thinner medication. If the patient is still at the rehab facility at time of the two week follow up appointment, the skilled rehab facility will also need to assist the patient in arranging follow up appointment in our office and any transportation needs. ° °MAKE SURE YOU:  °Understand these instructions.  °Will watch your condition.  °Will get help right away if you are not doing well or get worse. ° °Pick up stool softner and  laxative for home use following surgery while on pain medications. °Do not remove your dressing. °The dressing is waterproof--it is OK to take showers. °Continue to use ice for pain and swelling after surgery. °Do not use any lotions or creams on the incision until instructed by your surgeon. °Total Hip Protocol. ° ° °

## 2017-01-25 ENCOUNTER — Encounter (HOSPITAL_COMMUNITY): Payer: Self-pay | Admitting: Orthopedic Surgery

## 2017-01-25 LAB — BASIC METABOLIC PANEL
ANION GAP: 8 (ref 5–15)
BUN: 20 mg/dL (ref 6–20)
CHLORIDE: 111 mmol/L (ref 101–111)
CO2: 20 mmol/L — AB (ref 22–32)
Calcium: 8.6 mg/dL — ABNORMAL LOW (ref 8.9–10.3)
Creatinine, Ser: 0.88 mg/dL (ref 0.44–1.00)
GFR calc Af Amer: >60 mL/min (ref >60)
GLUCOSE: 178 mg/dL — AB (ref 65–99)
POTASSIUM: 3.2 mmol/L — AB (ref 3.5–5.1)
Sodium: 139 mmol/L (ref 135–145)

## 2017-01-25 LAB — POCT I-STAT 4, (NA,K, GLUC, HGB,HCT)
GLUCOSE: 97 mg/dL (ref 65–99)
HEMATOCRIT: 23 % — AB (ref 36.0–46.0)
HEMOGLOBIN: 7.8 g/dL — AB (ref 12.0–15.0)
POTASSIUM: 3.4 mmol/L — AB (ref 3.5–5.1)
Sodium: 142 mmol/L (ref 135–145)

## 2017-01-25 LAB — CBC
HEMATOCRIT: 25.8 % — AB (ref 36.0–46.0)
HEMOGLOBIN: 8.7 g/dL — AB (ref 12.0–15.0)
MCH: 33.7 pg (ref 26.0–34.0)
MCHC: 33.7 g/dL (ref 30.0–36.0)
MCV: 100 fL (ref 78.0–100.0)
PLATELETS: 199 10*3/uL (ref 150–400)
RBC: 2.58 MIL/uL — AB (ref 3.87–5.11)
RDW: 12.7 % (ref 11.5–15.5)
WBC: 8.9 10*3/uL (ref 4.0–10.5)

## 2017-01-25 MED ORDER — DOCUSATE SODIUM 100 MG PO CAPS: 100.0000 mg | ORAL_CAPSULE | Freq: Two times a day (BID) | ORAL | 1 refills | Status: DC

## 2017-01-25 MED ORDER — ONDANSETRON HCL 4 MG PO TABS: 4.0000 mg | ORAL_TABLET | Freq: Four times a day (QID) | ORAL | 0 refills | Status: DC | PRN

## 2017-01-25 MED ORDER — HYDROCODONE-ACETAMINOPHEN 5-325 MG PO TABS: 1.0000 | ORAL_TABLET | ORAL | 0 refills | Status: DC | PRN

## 2017-01-25 MED ORDER — SENNA 8.6 MG PO TABS: 2.0000 | ORAL_TABLET | Freq: Every day | ORAL | 0 refills | Status: DC

## 2017-01-25 MED ORDER — ASPIRIN 81 MG PO CHEW: 81.0000 mg | CHEWABLE_TABLET | Freq: Two times a day (BID) | ORAL | 1 refills | Status: DC

## 2017-01-25 MED FILL — HYDROCODON-APAP 5-325: 5-325 | 4 days supply | Qty: 50 | Fill #0

## 2017-01-25 MED FILL — ONDANSETRON HCL 4 MG TABLET: 4 | 5 days supply | Qty: 20 | Fill #0

## 2017-01-25 NOTE — Progress Notes (Signed)
01/24/17 2034: Ortho on call physician Dr.Rogers regarding patient complaining of seeing "black dots" and blurred vision with the dose of toradol given. Instructed to hold toradol dose.   01/25/17 0303: Spoke with Dr. Stann Mainland regarding patient blood pressure 68/48 manually. Patient asymptomatic and resting at current. Dr. Stann Mainland informed RN to monitor patient's status and follow up with Dr. Lyla Glassing in am.

## 2017-01-25 NOTE — Progress Notes (Addendum)
Case manager supplied 3-in-1, patient reports that will work for her shower. Prescriptions taken to outpatient pharmacy by Tawni Levy asst.dir.  Patient reports she will manage with the Xanax she has left.  All other concerns and questions have been answered and resolved for patient. Awaiting prescriptions and ride.

## 2017-01-25 NOTE — Progress Notes (Signed)
Physical Therapy Treatment Patient Details Name: Dawn Casey MRN: [id number] DOB: 1955-04-13 Today's Date: 01/25/2017    History of Present Illness 61yo F s/p right THA, anterior approach. PMH: anxiety, DJD spine, hip arthritis, HTN, skin cancer    PT Comments    Pt very motivated and progressing well with mobility.  Reviewed stairs, shower transfers, car transfers and home therex with written program supplied.   Follow Up Recommendations  DC plan and follow up therapy as arranged by surgeon     Equipment Recommendations  3in1 (PT)    Recommendations for Other Services OT consult     Precautions / Restrictions Precautions Precautions: Fall Restrictions Weight Bearing Restrictions: No Other Position/Activity Restrictions: WBAT    Mobility  Bed Mobility Overal bed mobility: Needs Assistance Bed Mobility: Supine to Sit     Supine to sit: Min guard     General bed mobility comments: Pt up in chair and requests back to same  Transfers Overall transfer level: Needs assistance Equipment used: Rolling [place] (2 wheeled) Transfers: Sit to/from Stand Sit to Stand: Supervision         General transfer comment: cues for LE management and use of UEs to self assist  Ambulation/Gait Ambulation/Gait assistance: Min guard;Supervision Ambulation Distance (Feet): 300 Feet Assistive device: Rolling Probert (2 wheeled) Gait Pattern/deviations: Step-to pattern;Step-through pattern;Decreased step length - right;Decreased step length - left;Shuffle Gait velocity: decr Gait velocity interpretation: Below normal speed for age/gender General Gait Details: cues for posture, position from RW and initial sequence   Stairs Stairs: Yes   Stair Management: One rail Right;Step to pattern;Forwards Number of Stairs: 2 General stair comments: Pt utilizing single rail with both hands.  Cues for sequence  Wheelchair Mobility    Modified Rankin (Stroke Patients Only)        Balance Overall balance assessment: No apparent balance deficits (not formally assessed)                                          Cognition Arousal/Alertness: Awake/alert Behavior During Therapy: WFL for tasks assessed/performed Overall Cognitive Status: Within Functional Limits for tasks assessed                                        Exercises Total Joint Exercises Ankle Circles/Pumps: AROM;Both;15 reps;Supine Quad Sets: AROM;Both;10 reps;Supine Heel Slides: AAROM;Right;20 reps;Supine Hip ABduction/ADduction: AAROM;Right;15 reps;Supine Long Arc Quad: AROM;Right;10 reps;Seated    General Comments        Pertinent Vitals/Pain Pain Assessment: 0-10 Pain Score: 3  Pain Location: right hip Pain Descriptors / Indicators: Burning;Sore Pain Intervention(s): Limited activity within patient's tolerance;Monitored during session;Premedicated before session    [place] expects to be discharged to:: Private residence Living Arrangements: Alone Available Help at Discharge: Family Type of Home: House Home Access: Stairs to enter Entrance Stairs-Rails: None Home Layout: One level Home Equipment: Environmental consultant - 2 wheels      Prior Function Level of Independence: Independent;Independent with assistive device(s)          PT Goals (current goals can now be found in the care plan section) Acute Rehab PT Goals Patient Stated Goal: to go home today PT Goal Formulation: With patient Time For Goal Achievement: 01/27/17 Potential to Achieve Goals: Good Progress towards PT goals: Progressing toward goals  Frequency    7X/week      PT Plan Current plan remains appropriate    Co-evaluation              AM-PAC PT "6 Clicks" Daily Activity  Outcome Measure  Difficulty turning over in bed (including adjusting bedclothes, sheets and blankets)?: A Lot Difficulty moving from lying on back to sitting on the side of the bed? : A  Little Difficulty sitting down on and standing up from a chair with arms (e.g., wheelchair, bedside commode, etc,.)?: A Little Help needed moving to and from a bed to chair (including a wheelchair)?: A Little Help needed walking in hospital room?: A Little Help needed climbing 3-5 steps with a railing? : A Little 6 Click Score: 17    End of Session Equipment Utilized During Treatment: Gait belt Activity Tolerance: Patient tolerated treatment well Patient left: in chair;with call bell/phone within reach Nurse Communication: Mobility status PT Visit Diagnosis: Difficulty in walking, not elsewhere classified (R26.2)     Time: 6256-3893 PT Time Calculation (min) (ACUTE ONLY): 32 min  Charges:  $Gait Training: 8-22 mins $Therapeutic Exercise: 8-22 mins                    G Codes:       Pg [phone]    Dawn Casey 01/25/2017, 2:52 PM

## 2017-01-25 NOTE — Progress Notes (Signed)
   Subjective:  Patient reports pain as mild to moderate.  Denies N/V/CP/SOB.  Objective:   VITALS:   Vitals:   01/24/17 1920 01/24/17 2020 01/25/17 0248 01/25/17 0445  BP: (!) 128/94 131/85 (!) 68/48 (!) 80/68  Pulse: (!) 55 (!) 57 (!) 51 (!) 59  Resp: 16 20 13 15   Temp: 98.1 F (36.7 C) (!) 97.4 F (36.3 C) 97.7 F (36.5 C) 97.9 F (36.6 C)  TempSrc: Oral Oral Axillary Oral  SpO2: 100% 100% 100% 100%  Weight:      Height:        NAD ABD soft Intact pulses distally Dorsiflexion/Plantar flexion intact Incision: dressing C/D/I Compartment soft   Lab Results  Component Value Date   WBC 8.9 01/25/2017   HGB 8.7 (L) 01/25/2017   HCT 25.8 (L) 01/25/2017   MCV 100.0 01/25/2017   PLT 199 01/25/2017   BMET    Component Value Date/Time   NA 139 01/25/2017 0543   NA 140 09/25/2016 1146   K 3.2 (L) 01/25/2017 0543   CL 111 01/25/2017 0543   CO2 20 (L) 01/25/2017 0543   GLUCOSE 178 (H) 01/25/2017 0543   BUN 20 01/25/2017 0543   BUN 15 09/25/2016 1146   CREATININE 0.88 01/25/2017 0543   CREATININE 0.94 01/24/2016 1027   CALCIUM 8.6 (L) 01/25/2017 0543   GFRNONAA >60 01/25/2017 0543   GFRNONAA 67 01/24/2016 1027   GFRAA >60 01/25/2017 0543   GFRAA 77 01/24/2016 1027     Assessment/Plan: 1 Day Post-Op   Principal Problem:   Osteoarthritis of right hip   WBAT with Goller Hypotension: due to volume loss, asymptomatic, encourage PO fluids ASA, SCDs, TEDS PO pain control PT/OT D/C home with HEP   Dawn Casey Dawn Casey 01/25/2017, 10:20 AM   Rod Can, MD Cell [phone]

## 2017-01-25 NOTE — Progress Notes (Signed)
Occupational Therapy Evaluation Patient Details Name: Dawn Casey MRN: [id number] DOB: [birth year] Today's Date: 01/25/2017    History of Present Illness 61yo F s/p right THA, anterior approach. PMH: anxiety, DJD spine, hip arthritis, HTN, skin cancer   Clinical Impression   Patient presents to OT after having ADL/equipment questions during PT session. She states she has been "dealing with this hip" for 2 years in much more pain than she is now and does not anticipate any problems. She is asking about this hospital providing a suction grab bar, which is what her insurance company told her. Informed her that we do not stock this item and SW to follow up with insurance company. Patient feels confident that she can manage ADLs and IADLs at home. She will need a 3 in 1 for home use. No further OT needs identified; will sign off.    Follow Up Recommendations  No OT follow up    Equipment Recommendations  3 in 1 bedside commode;Other (comment)(suction grab bar - SW to follow up)    Recommendations for Other Services       Precautions / Restrictions Precautions Precautions: None Restrictions Weight Bearing Restrictions: No Other Position/Activity Restrictions: WBAT      Mobility Bed Mobility               General bed mobility comments: NT -- OOB in recliner  Transfers                 General transfer comment: pt declined practicing toileting; states her foley came out about 1 hour ago and she does not have the urge but will try with nurse tech soon and does not anticipate any problems    Balance                                           ADL either performed or assessed with clinical judgement   ADL Overall ADL's : Needs assistance/impaired                                       General ADL Comments: Patient received up in recliner. Reviewed role of OT. Patient reports that she has been able to do ADLs "dealing with this  hip" for the past 2 years and has no difficulties. Reviewed home environment and potential equipment needs with patient. She states her insurance company said that this hospital would provide suction grab bar. Informed patient that we do not stock this item; SW also made aware and to contact patient's insurance company. Patient reports she has 4 pets at home that she cares for and she does all her cooking and cleaning, even with her hip problems. She reports her pain prior to surgery was a "12" out of 10, now it's down to a 1/10 so she does not anticipate any problems. Patient left in recliner, call bell and tray table in reach.     Vision Baseline Vision/History: Wears glasses Vision Assessment?: No apparent visual deficits     Perception     Praxis      Pertinent Vitals/Pain Pain Assessment: 0-10 Pain Score: 1  Pain Location: right hip Pain Descriptors / Indicators: Burning Pain Intervention(s): Limited activity within patient's tolerance;Monitored during session     Hand Dominance     Extremity/Trunk Assessment  Upper Extremity Assessment Upper Extremity Assessment: Overall WFL for tasks assessed   Lower Extremity Assessment Lower Extremity Assessment: Defer to PT evaluation       Communication Communication Communication: No difficulties   Cognition Arousal/Alertness: Awake/alert Behavior During Therapy: WFL for tasks assessed/performed Overall Cognitive Status: Within Functional Limits for tasks assessed                                     General Comments       Exercises     Shoulder Instructions      Home Living Family/patient expects to be discharged to:: Private residence Living Arrangements: Alone   Type of Home: House             Bathroom Shower/Tub: Occupational psychologist: Standard Bathroom Accessibility: Yes How Accessible: Accessible via [place] Home Equipment: Vittorio - 2 wheels          Prior  Functioning/Environment Level of Independence: Independent;Independent with assistive device(s)                 OT Problem List: Decreased strength;Decreased activity tolerance;Decreased knowledge of use of DME or AE      OT Treatment/Interventions:      OT Goals(Current goals can be found in the care plan section) Acute Rehab OT Goals Patient Stated Goal: to go home today OT Goal Formulation: All assessment and education complete, DC therapy  OT Frequency:     Barriers to D/C:            Co-evaluation              AM-PAC PT "6 Clicks" Daily Activity     Outcome Measure Help from another person eating meals?: None Help from another person taking care of personal grooming?: None Help from another person toileting, which includes using toliet, bedpan, or urinal?: A Little Help from another person bathing (including washing, rinsing, drying)?: A Little Help from another person to put on and taking off regular upper body clothing?: None Help from another person to put on and taking off regular lower body clothing?: A Little 6 Click Score: 21   End of Session    Activity Tolerance: Patient tolerated treatment well Patient left: in chair;with call bell/phone within reach  OT Visit Diagnosis: Unsteadiness on feet (R26.81)                Time: 1749-4496 OT Time Calculation (min): 13 min Charges:  OT General Charges $OT Visit: 1 Visit OT Evaluation $OT Eval Low Complexity: 1 Low G-Codes:       Krislynn Gronau A Jarah Pember 2017/01/26, 11:38 AM

## 2017-01-25 NOTE — Discharge Summary (Signed)
Physician Discharge Summary  Patient ID: Dawn Casey MRN: [id number] DOB/AGE: 06-Mar-1955 61 y.o.  Admit date: 01/24/2017 Discharge date: 01/25/2017  Admission Diagnoses:  Osteoarthritis of right hip  Discharge Diagnoses:  Principal Problem:   Osteoarthritis of right hip   Past Medical History:  Diagnosis Date  . Allergy   . Anxiety   . Degenerative joint disease (DJD) of lumbar spine   . Hip arthritis    Per rads.   . Hypertension   . Skin cancer 08/12/2015   Per patient's report.     Surgeries: Procedure(s): RIGHT TOTAL HIP ARTHROPLASTY ANTERIOR APPROACH on 01/24/2017   Consultants (if any):   Discharged Condition: Improved  Hospital Course: Dawn Casey is an 61 y.o. female who was admitted 01/24/2017 with a diagnosis of Osteoarthritis of right hip and went to the operating room on 01/24/2017 and underwent the above named procedures.    She was given perioperative antibiotics:  Anti-infectives (From admission, onward)   Start     Dose/Rate Route Frequency Ordered Stop   01/24/17 2000  clindamycin (CLEOCIN) IVPB 600 mg     600 mg 100 mL/hr over 30 Minutes Intravenous Every 6 hours 01/24/17 1832 01/25/17 0232   01/24/17 1023  clindamycin (CLEOCIN) IVPB 900 mg     900 mg 100 mL/hr over 30 Minutes Intravenous On call to O.R. 01/24/17 1023 01/24/17 1415    .  She was given sequential compression devices, early ambulation, and ASA for DVT prophylaxis.  She benefited maximally from the hospital stay and there were no complications.    Recent vital signs:  Vitals:   01/25/17 1033 01/25/17 1301  BP: 100/64 103/65  Pulse: 71 64  Resp: 15 15  Temp: 98.4 F (36.9 C) 97.8 F (36.6 C)  SpO2: 100% 100%    Recent laboratory studies:  Lab Results  Component Value Date   HGB 8.7 (L) 01/25/2017   HGB 7.8 (L) 01/24/2017   HGB 10.8 (L) 01/22/2017   Lab Results  Component Value Date   WBC 8.9 01/25/2017   PLT 199 01/25/2017   No results found for:  INR Lab Results  Component Value Date   NA 139 01/25/2017   K 3.2 (L) 01/25/2017   CL 111 01/25/2017   CO2 20 (L) 01/25/2017   BUN 20 01/25/2017   CREATININE 0.88 01/25/2017   GLUCOSE 178 (H) 01/25/2017    Discharge Medications:   Allergies as of 01/25/2017      Reactions   Tramadol    Causes vomiting and bleeding esophageal   Prozac [fluoxetine Hcl] Hives   Adhesive [tape] Rash   Amoxicillin Rash   Has patient had a PCN reaction causing immediate rash, facial/tongue/throat swelling, SOB or lightheadedness with hypotension: Unknown Has patient had a PCN reaction causing severe rash involving mucus membranes or skin necrosis: Unknown Has patient had a PCN reaction that required hospitalization: Unknown Has patient had a PCN reaction occurring within the last 10 years: No If all of the above answers are "NO", then may proceed with Cephalosporin use.   Erythromycin Rash   Sulfa Antibiotics Rash      Medication List    STOP taking these medications   acetaminophen 500 MG tablet Commonly known as:  TYLENOL   traMADol 50 MG tablet Commonly known as:  ULTRAM     TAKE these medications   ADULT GUMMY PO Take 2 tablets daily by mouth.   ALPRAZolam 0.5 MG tablet Commonly known as:  XANAX Take 0.5-1  tablets (0.25-0.5 mg total) by mouth 2 (two) times daily as needed for anxiety. What changed:  how much to take   aspirin 81 MG chewable tablet Chew 1 tablet (81 mg total) by mouth 2 (two) times daily.   BIOTIN PO Take 3 each daily by mouth. Hair,  skin and nails gummy   docusate sodium 100 MG capsule Commonly known as:  COLACE Take 1 capsule (100 mg total) by mouth 2 (two) times daily.   DUEXIS 800-26.6 MG Tabs Generic drug:  Ibuprofen-Famotidine Take 1 tablet 3 (three) times daily as needed by mouth (pain).   HYDROcodone-acetaminophen 5-325 MG tablet Commonly known as:  NORCO/VICODIN Take 1-2 tablets by mouth every 4 (four) hours as needed (hip pain).   IRON  PO Take 1 tablet daily by mouth.   lisinopril 20 MG tablet Commonly known as:  PRINIVIL,ZESTRIL Take 1 tablet (20 mg total) by mouth daily.   ondansetron 4 MG tablet Commonly known as:  ZOFRAN Take 1 tablet (4 mg total) by mouth every 6 (six) hours as needed for nausea.   propranolol 20 MG tablet Commonly known as:  INDERAL Take 1 tablet (20 mg total) by mouth daily.   senna 8.6 MG Tabs tablet Commonly known as:  SENOKOT Take 2 tablets (17.2 mg total) by mouth at bedtime.            Durable Medical Equipment  (From admission, onward)        Start     Ordered   01/24/17 1833  DME Rudy rolling  Once    Question:  Patient needs a Folts to treat with the following condition  Answer:  Status post total replacement of right hip   01/24/17 1832   01/24/17 1833  DME 3 n 1  Once     01/24/17 1832      Diagnostic Studies: Dg Pelvis Portable  Result Date: 01/24/2017 CLINICAL DATA:  Postop right hip replacement. EXAM: PORTABLE PELVIS 1-2 VIEWS COMPARISON:  Fluoroscopy earlier today FINDINGS: Total right hip arthroplasty is located and well-seated. No evidence of periprosthetic fracture or other unexpected finding. Facet spurring on the right at L4-5. IMPRESSION: No acute finding after right hip arthroplasty. Electronically Signed   By: Monte Fantasia M.D.   On: 01/24/2017 17:24   Dg C-arm 61-120 Min-no Report  Result Date: 01/24/2017 Fluoroscopy was utilized by the requesting physician.  No radiographic interpretation.   Dg Hip Operative Unilat W Or W/o Pelvis Right  Result Date: 01/24/2017 CLINICAL DATA:  Right hip replacement. EXAM: DG C-ARM 61-120 MIN-NO REPORT; OPERATIVE RIGHT HIP WITH PELVIS CONTRAST:  None. FLUOROSCOPY TIME:  Fluoroscopy Time:  0 minutes 29 seconds Radiation Exposure Index (if provided by the fluoroscopic device): 1.7 mGy Number of Acquired Spot Images: 6 COMPARISON:  12/28/2014. FINDINGS: Total right hip replacement. Hardware intact. Anatomic  alignment. No acute abnormality. IMPRESSION: Total right hip replacement. Anatomic alignment. No acute abnormality. Electronically Signed   By: Marcello Moores  Register   On: 01/24/2017 15:54    Disposition: 01-Home or Self Care  Discharge Instructions    Call MD / Call 911   Complete by:  As directed    If you experience chest pain or shortness of breath, CALL 911 and be transported to the hospital emergency room.  If you develope a fever above 101 F, pus (white drainage) or increased drainage or redness at the wound, or calf pain, call your surgeon's office.   Constipation Prevention   Complete by:  As directed  Drink plenty of fluids.  Prune juice may be helpful.  You may use a stool softener, such as Colace (over the counter) 100 mg twice a day.  Use MiraLax (over the counter) for constipation as needed.   Diet - low sodium heart healthy   Complete by:  As directed    Driving restrictions   Complete by:  As directed    No driving for 6 weeks   Increase activity slowly as tolerated   Complete by:  As directed    Lifting restrictions   Complete by:  As directed    No lifting for 6 weeks   TED hose   Complete by:  As directed    Use stockings (TED hose) for 2 weeks on both leg(s).  You may remove them at night for sleeping.      Follow-up Information    Liora Myles, Aaron Edelman, MD. Schedule an appointment as soon as possible for a visit in 2 weeks.   Specialty:  Orthopedic Surgery Why:  For wound re-check Contact information: [address] [place] [address] [phone]            Signed: Hilton Cork Sion Reinders 01/25/2017, 4:33 PM

## 2017-01-25 NOTE — Progress Notes (Signed)
Spoke with patient at bedside. Patient states she has plans for HEP, requesting 3n1 but has RW. Contacted [facility] to deliver 3n1 to room. States her insurance company told her not to leave without a grab bar, unable to give me a contact for this issue. Contacted [employer], awaiting call back. Told patient that that is something we don't generally provide nor does insurance cover. [phone]

## 2017-01-25 NOTE — Progress Notes (Signed)
Patient agitated and upset. Patient reports "someone" told her several things that are not happening including:  Hospital providing shower grab bars, the doctor calling prescriptions into the pharmacy, staff taking the prescriptions to the outpatient pharmacy in they cannot be called in, and the doctor writing a prescription for xanax as patient only has 10 pills left and she was told she could use it as pain control. Patient unable to say who told her these things, either someone in Dr Noralyn Pick office or during pre-op clinic. Case manager and therapy explained to patient that hospital does not supply shower grab bars, that they can be purchased at a regular pharmacy. Staff explained that prescriptions cannot be called in nor can staff leave the floor to take prescriptions to outpatient pharmacy. Dr Delfino Lovett notified about xanax, reports she did not mention it to him, he cannot write for the Xanax as he will not be monitoring it.  Patient agitated, reports that she only has a friend that will be staying with her and she is not comfortable asking him to "drive all over to get my medications and equipment while he's supposed to be taking care of me and my pets."  Will ask administration to get involved to help assist with patient needs.

## 2017-01-25 NOTE — Evaluation (Signed)
Physical Therapy Evaluation Patient Details Name: Dawn Casey MRN: [id number] DOB: 20-Nov-1955 Today's Date: 01/25/2017   History of Present Illness  61yo F s/p right THA, anterior approach. PMH: anxiety, DJD spine, hip arthritis, HTN, skin cancer  Clinical Impression  Pt s/p R THR and presents with decreased R LE strength/ROM and post op pain limiting functional mobility.  Pt should progress to dc home with assist of friends.    Follow Up Recommendations DC plan and follow up therapy as arranged by surgeon    Equipment Recommendations  3in1 (PT)    Recommendations for Other Services OT consult     Precautions / Restrictions Precautions Precautions: Fall Restrictions Weight Bearing Restrictions: No Other Position/Activity Restrictions: WBAT      Mobility  Bed Mobility Overal bed mobility: Needs Assistance Bed Mobility: Supine to Sit     Supine to sit: Min guard     General bed mobility comments: cues for sequence and use of L LE to self assist  Transfers Overall transfer level: Needs assistance Equipment used: Rolling Ohms (2 wheeled) Transfers: Sit to/from Stand Sit to Stand: Min guard         General transfer comment: cues for LE management and use of UEs to self assist  Ambulation/Gait Ambulation/Gait assistance: Min guard Ambulation Distance (Feet): 180 Feet Assistive device: Rolling Boschert (2 wheeled) Gait Pattern/deviations: Step-to pattern;Step-through pattern;Decreased step length - right;Decreased step length - left;Shuffle Gait velocity: decr Gait velocity interpretation: Below normal speed for age/gender General Gait Details: cues for posture, position from RW and initial sequence  Stairs            Wheelchair Mobility    Modified Rankin (Stroke Patients Only)       Balance                                             Pertinent Vitals/Pain Pain Assessment: 0-10 Pain Score: 3  Pain Location: right  hip Pain Descriptors / Indicators: Burning;Sore Pain Intervention(s): Limited activity within patient's tolerance;Monitored during session;Premedicated before session;Ice applied    Home Living Family/patient expects to be discharged to:: Private residence Living Arrangements: Alone Available Help at Discharge: Family Type of Home: House Home Access: Stairs to enter Entrance Stairs-Rails: None Entrance Stairs-Number of Steps: 2 Home Layout: One level Home Equipment: Frediani - 2 wheels      Prior Function Level of Independence: Independent;Independent with assistive device(s)               Hand Dominance        Extremity/Trunk Assessment   Upper Extremity Assessment Upper Extremity Assessment: Overall WFL for tasks assessed    Lower Extremity Assessment Lower Extremity Assessment: RLE deficits/detail RLE Deficits / Details: Strength at hip 2+/5 with AAROM at hip to 90 flex and 20 abd    Cervical / Trunk Assessment Cervical / Trunk Assessment: Normal  Communication   Communication: No difficulties  Cognition Arousal/Alertness: Awake/alert Behavior During Therapy: WFL for tasks assessed/performed Overall Cognitive Status: Within Functional Limits for tasks assessed                                        General Comments      Exercises Total Joint Exercises Ankle Circles/Pumps: AROM;Both;15 reps;Supine Quad Sets: AROM;Both;10 reps;Supine Heel  Slides: AAROM;Right;20 reps;Supine Hip ABduction/ADduction: AAROM;Right;15 reps;Supine   Assessment/Plan    PT Assessment Patient needs continued PT services  PT Problem List Decreased strength;Decreased range of motion;Decreased activity tolerance;Decreased mobility;Decreased knowledge of use of DME;Pain       PT Treatment Interventions DME instruction;Gait training;Stair training;Functional mobility training;Therapeutic activities;Therapeutic exercise;Patient/family education    PT Goals (Current  goals can be found in the Care Plan section)  Acute Rehab PT Goals Patient Stated Goal: to go home today PT Goal Formulation: With patient Time For Goal Achievement: 01/27/17 Potential to Achieve Goals: Good    Frequency 7X/week   Barriers to discharge        Co-evaluation               AM-PAC PT "6 Clicks" Daily Activity  Outcome Measure Difficulty turning over in bed (including adjusting bedclothes, sheets and blankets)?: A Lot Difficulty moving from lying on back to sitting on the side of the bed? : A Lot Difficulty sitting down on and standing up from a chair with arms (e.g., wheelchair, bedside commode, etc,.)?: A Little Help needed moving to and from a bed to chair (including a wheelchair)?: A Little Help needed walking in hospital room?: A Little Help needed climbing 3-5 steps with a railing? : A Little 6 Click Score: 16    End of Session Equipment Utilized During Treatment: Gait belt Activity Tolerance: Patient tolerated treatment well Patient left: in chair;with call bell/phone within reach Nurse Communication: Mobility status PT Visit Diagnosis: Difficulty in walking, not elsewhere classified (R26.2)    Time: 2992-4268 PT Time Calculation (min) (ACUTE ONLY): 39 min   Charges:   PT Evaluation $PT Eval Low Complexity: 1 Low PT Treatments $Gait Training: 8-22 mins $Therapeutic Exercise: 8-22 mins   PT G Codes:        Pg [phone]   Farhan Jean 01/25/2017, 12:51 PM

## 2017-01-25 NOTE — Progress Notes (Signed)
Patient discharged to home w/ friend. Given all belongings, instructions, equipment. Pharmacy called, confirmed Rxs ready for pickup. Escorted to pov via w/c.

## 2017-01-27 NOTE — Anesthesia Postprocedure Evaluation (Signed)
Anesthesia Post Note  Patient: Dawn Casey  Procedure(s) Performed: RIGHT TOTAL HIP ARTHROPLASTY ANTERIOR APPROACH (Right Hip)     Patient location during evaluation: PACU Anesthesia Type: Spinal Level of consciousness: awake Pain management: satisfactory to patient Vital Signs Assessment: post-procedure vital signs reviewed and stable Respiratory status: spontaneous breathing Cardiovascular status: blood pressure returned to baseline Postop Assessment: no headache and spinal receding Anesthetic complications: no    Last Vitals:  Vitals:   01/25/17 1033 01/25/17 1301  BP: 100/64 103/65  Pulse: 71 64  Resp: 15 15  Temp: 36.9 C 36.6 C  SpO2: 100% 100%    Last Pain:  Vitals:   01/25/17 1446  TempSrc:   PainSc: 1                  Amber Williard EDWARD

## 2017-02-08 DIAGNOSIS — Z96641 Presence of right artificial hip joint: Secondary | ICD-10-CM | POA: Diagnosis not present

## 2017-02-08 DIAGNOSIS — Z471 Aftercare following joint replacement surgery: Secondary | ICD-10-CM | POA: Diagnosis not present

## 2017-02-11 ENCOUNTER — Other Ambulatory Visit: Payer: Self-pay | Admitting: Family Medicine

## 2017-02-11 DIAGNOSIS — I10 Essential (primary) hypertension: Secondary | ICD-10-CM

## 2017-02-11 DIAGNOSIS — F4323 Adjustment disorder with mixed anxiety and depressed mood: Secondary | ICD-10-CM

## 2017-02-12 NOTE — Telephone Encounter (Signed)
Discussed in July, intermittent use. Xanax refilled.

## 2017-03-11 DIAGNOSIS — Z85828 Personal history of other malignant neoplasm of skin: Secondary | ICD-10-CM | POA: Diagnosis not present

## 2017-03-11 DIAGNOSIS — D2261 Melanocytic nevi of right upper limb, including shoulder: Secondary | ICD-10-CM | POA: Diagnosis not present

## 2017-03-12 DIAGNOSIS — M1611 Unilateral primary osteoarthritis, right hip: Secondary | ICD-10-CM | POA: Diagnosis not present

## 2017-03-26 DIAGNOSIS — Z1151 Encounter for screening for human papillomavirus (HPV): Secondary | ICD-10-CM | POA: Diagnosis not present

## 2017-03-26 DIAGNOSIS — Z6821 Body mass index (BMI) 21.0-21.9, adult: Secondary | ICD-10-CM | POA: Diagnosis not present

## 2017-03-26 DIAGNOSIS — Z1231 Encounter for screening mammogram for malignant neoplasm of breast: Secondary | ICD-10-CM | POA: Diagnosis not present

## 2017-03-26 DIAGNOSIS — Z01419 Encounter for gynecological examination (general) (routine) without abnormal findings: Secondary | ICD-10-CM | POA: Diagnosis not present

## 2017-04-23 DIAGNOSIS — Z471 Aftercare following joint replacement surgery: Secondary | ICD-10-CM | POA: Diagnosis not present

## 2017-04-23 DIAGNOSIS — Z96641 Presence of right artificial hip joint: Secondary | ICD-10-CM | POA: Diagnosis not present

## 2017-05-11 ENCOUNTER — Other Ambulatory Visit: Payer: Self-pay | Admitting: Family Medicine

## 2017-05-11 DIAGNOSIS — I10 Essential (primary) hypertension: Secondary | ICD-10-CM

## 2017-05-16 ENCOUNTER — Other Ambulatory Visit: Payer: Self-pay | Admitting: Family Medicine

## 2017-05-16 DIAGNOSIS — F4323 Adjustment disorder with mixed anxiety and depressed mood: Secondary | ICD-10-CM

## 2017-05-16 DIAGNOSIS — I10 Essential (primary) hypertension: Secondary | ICD-10-CM

## 2017-05-16 NOTE — Telephone Encounter (Signed)
Refill of Xanax, Inderal  LOV 09/25/16  Dr. Carlota Raspberry  [id number] - [place], [place] - [address]

## 2017-05-18 NOTE — Telephone Encounter (Signed)
Refilled. Please schedule OV prior to next refills. Thanks.

## 2017-06-16 ENCOUNTER — Other Ambulatory Visit: Payer: Self-pay | Admitting: Family Medicine

## 2017-06-16 DIAGNOSIS — I10 Essential (primary) hypertension: Secondary | ICD-10-CM

## 2017-06-17 NOTE — Telephone Encounter (Signed)
Patient called, left detailed VM to return call to the office to schedule a f/u of BP appointment with Dr. Nyoka Cowden in order to receive refills on Lisinopril.

## 2017-06-17 NOTE — Telephone Encounter (Signed)
Last OV:09/25/16 Last filled:05/11/17   30 tab/0 refills PCP: [place]: [id number] - [place], [place]. [phone] (Phone) [phone] (Fax)

## 2017-07-01 ENCOUNTER — Ambulatory Visit: Payer: Self-pay

## 2017-07-01 NOTE — Telephone Encounter (Signed)
Patient called in with c/o "low BP." She says "I lost about 15 pounds in preparation for my hip surgery last November. I have been on the same dosage of BP medicines since then. I checked my BP at [facility] and it was 90/44. When I got home it was 115/70. I took my medication. During the night, I wake up to go to the bathroom and while I'm walking to the bathroom, I feel faint and fall out in bathroom. I don't lose consciousness, but I do hit the floor. I have done this about 4 times and I trip when I get up abruptly too. I was supposed to follow up after my surgery and I need to see him to get my medication adjusted." I asked her to check her BP, it was 126/96, she says "I am getting upset, that's why it's going up. I haven't taken my medication today yet." I asked is she having dizziness now, she says "only at night when I get up suddenly." According to protocol, see PCP within 24 hours, appointment scheduled for tomorrow at 1440, care advice given, patient verbalized understanding.  Reason for Disposition . [3] Systolic BP 49-179 AND [1] taking blood pressure medications AND [3] NOT dizzy, lightheaded or weak  Answer Assessment - Initial Assessment Questions 1. BLOOD PRESSURE: "What is the blood pressure?" "Did you take at least two measurements 5 minutes apart?"     126/96 2. ONSET: "When did you take your blood pressure?"     Now 3. HOW: "How did you obtain the blood pressure?" (e.g., visiting nurse, automatic home BP monitor)     Automatic home BP monitor 4. HISTORY: "Do you have a history of low blood pressure?" "What is your blood pressure normally?"     Yes 5. MEDICATIONS: "Are you taking any medications for blood pressure?" If yes: "Have they been changed recently?"     Yes 6. PULSE RATE: "Do you know what your pulse rate is?"     61 7. OTHER SYMPTOMS: "Have you been sick recently?" "Have you had a recent injury?"     Dizzy at night when BP, feel faint before falling x 6 total, tripped 8.  PREGNANCY: "Is there any chance you are pregnant?" "When was your last menstrual period?"     No  Protocols used: LOW BLOOD PRESSURE-A-AH

## 2017-07-02 ENCOUNTER — Encounter: Payer: Self-pay | Admitting: Family Medicine

## 2017-07-02 ENCOUNTER — Ambulatory Visit: Payer: BLUE CROSS/BLUE SHIELD | Admitting: Family Medicine

## 2017-07-02 VITALS — BP 109/76 | HR 71 | Temp 98.3°F | Ht 65.75 in | Wt 123.8 lb

## 2017-07-02 DIAGNOSIS — I959 Hypotension, unspecified: Secondary | ICD-10-CM | POA: Diagnosis not present

## 2017-07-02 DIAGNOSIS — D649 Anemia, unspecified: Secondary | ICD-10-CM | POA: Diagnosis not present

## 2017-07-02 DIAGNOSIS — R42 Dizziness and giddiness: Secondary | ICD-10-CM

## 2017-07-02 DIAGNOSIS — E876 Hypokalemia: Secondary | ICD-10-CM | POA: Diagnosis not present

## 2017-07-02 DIAGNOSIS — S0083XA Contusion of other part of head, initial encounter: Secondary | ICD-10-CM | POA: Diagnosis not present

## 2017-07-02 LAB — POCT CBC
GRANULOCYTE PERCENT: 72.4 % (ref 37–80)
HEMATOCRIT: 34 % — AB (ref 37.7–47.9)
HEMOGLOBIN: 11.2 g/dL — AB (ref 12.2–16.2)
Lymph, poc: 1.6 (ref 0.6–3.4)
MCH: 33.4 pg — AB (ref 27–31.2)
MCHC: 32.9 g/dL (ref 31.8–35.4)
MCV: 101.5 fL — AB (ref 80–97)
MID (cbc): 0.2 (ref 0–0.9)
MPV: 6.4 fL (ref 0–99.8)
PLATELET COUNT, POC: 307 10*3/uL (ref 142–424)
POC Granulocyte: 4.5 (ref 2–6.9)
POC LYMPH PERCENT: 25.1 %L (ref 10–50)
POC MID %: 2.5 %M (ref 0–12)
RBC: 3.35 M/uL — AB (ref 4.04–5.48)
RDW, POC: 13.4 %
WBC: 6.2 10*3/uL (ref 4.6–10.2)

## 2017-07-02 LAB — GLUCOSE, POCT (MANUAL RESULT ENTRY): POC GLUCOSE: 79 mg/dL (ref 70–99)

## 2017-07-02 MED ORDER — LISINOPRIL 10 MG PO TABS: 10.0000 mg | ORAL_TABLET | Freq: Every day | ORAL | 1 refills | Status: DC

## 2017-07-02 NOTE — Progress Notes (Signed)
Subjective:  By signing my name below, I, Dawn Casey, attest that this documentation has been prepared under the direction and in the presence of Dawn Ray, MD. Electronically Signed: Moises Casey, [place]. 07/02/2017 , 4:16 PM .  Patient was seen in Room 3 .   Patient ID: Dawn Casey, female    DOB: 1955-07-09, 62 y.o.   MRN: [id number] Chief Complaint  Patient presents with  . Fall    low BP and when she gets up quickly she gets dizzy and falls   HPI Dawn Casey is a 62 y.o. female  Patient presents with hypotension, with history of HTN.   Casey pressure She takes lisinopril 20mg  QD and propranolol 20mg  QD for HTN. Appears she called the nurse triage line yesterday, reported BP was 90/44. She informed fall after feeling faint in the bathroom, no LOC, but sounds like she was having orthostatic symptoms at that time. Her last office visit with me was in July 2018. She did have right hip surgery in Nov 2018, and reported weight loss since that surgery. She denies taking aspirin.   Wt Readings from Last 3 Encounters:  07/02/17 123 lb 12.8 oz (56.2 kg)  01/24/17 121 lb (54.9 kg)  01/22/17 125 lb (56.7 kg)   She reports having 5 days over the past 5 months where she would become lightheadedness. She's still taking the same HTN regime as before. She informs her BP being 101/70 before office visit, an hour before taking medications earlier this morning. She felt flushed in the past, so propranolol gave her some relief with that and also HTN. She had 3-4 glasses of wine last night, denies blacking out from alcohol.   Hypokalemia She had potassium of 3.2 on Nov 30th.   Anemia She's been taking OTC iron 65mg  QD. She had Casey transfusion after surgery. Her last hemoglobin was Nov 30th, 2018. She denies tar colored stools.    Lab Results  Component Value Date   WBC 8.9 01/25/2017   HGB 8.7 (L) 01/25/2017   HCT 25.8 (L) 01/25/2017   MCV 100.0 01/25/2017   PLT 199  01/25/2017   This lab was done at the time of surgery.    Right eye injury Patient states having a right eye injury after a fall 6 days ago. She informs her alarm going off, due to someone being in her patio in the middle of the night. She quickly got out of bed, became lightheaded and fell hitting her right orbit onto thin carpet. She denies continued headaches. This is her first evaluation for this. She states the area has been improving, with bruising turning yellow. She denies Casey in eye. She denies change in vision, or increased floaters. But, she notes still having pain around her right eye.   Patient Active Problem List   Diagnosis Date Noted  . History of alcohol abuse 03/20/2016  . Other depressive episodes 03/20/2016  . Degenerative joint disease 03/20/2016  . Osteoarthritis of right hip 01/24/2016  . Generalized anxiety disorder 01/16/2014  . Anemia 01/16/2014  . Essential hypertension 01/16/2014   Past Medical History:  Diagnosis Date  . Allergy   . Anxiety   . Degenerative joint disease (DJD) of lumbar spine   . Hip arthritis    Per rads.   . Hypertension   . Skin cancer 08/12/2015   Per patient's report.    Past Surgical History:  Procedure Laterality Date  . APPENDECTOMY    . BASAL CELL CARCINOMA  EXCISION  08/17/2015   From face  . TOTAL HIP ARTHROPLASTY Right 01/24/2017   Procedure: RIGHT TOTAL HIP ARTHROPLASTY ANTERIOR APPROACH;  Surgeon: Rod Can, MD;  Location: [facility];  Service: Orthopedics;  Laterality: Right;  Needs RNFA   Allergies  Allergen Reactions  . Sulfa Antibiotics Rash  . Tramadol     Causes vomiting and bleeding esophageal  . Prozac [Fluoxetine Hcl] Hives  . Adhesive [Tape] Rash  . Amoxicillin Rash    Has patient had a PCN reaction causing immediate rash, facial/tongue/throat swelling, SOB or lightheadedness with hypotension: Unknown Has patient had a PCN reaction causing severe rash involving mucus membranes or skin necrosis:  Unknown Has patient had a PCN reaction that required hospitalization: Unknown Has patient had a PCN reaction occurring within the last 10 years: No If all of the above answers are "NO", then may proceed with Cephalosporin use.   . Erythromycin Rash   Prior to Admission medications   Medication Sig Start Date End Date Taking? Authorizing Provider  ALPRAZolam Duanne Moron) 0.5 MG tablet TAKE 1/2 TO 1 TABLETS BY MOUTH TWICE A DAY AS NEEDED FOR ANXIETY 05/18/17   Wendie Agreste, MD  aspirin 81 MG chewable tablet Chew 1 tablet (81 mg total) by mouth 2 (two) times daily. 01/25/17   Swinteck, Aaron Edelman, MD  BIOTIN PO Take 3 each daily by mouth. Hair,  skin and nails gummy    [provider]  docusate sodium (COLACE) 100 MG capsule Take 1 capsule (100 mg total) by mouth 2 (two) times daily. 01/25/17   Swinteck, Aaron Edelman, MD  HYDROcodone-acetaminophen (NORCO/VICODIN) 5-325 MG tablet Take 1-2 tablets by mouth every 4 (four) hours as needed (hip pain). 01/25/17   Swinteck, Aaron Edelman, MD  Ibuprofen-Famotidine (DUEXIS) 800-26.6 MG TABS Take 1 tablet 3 (three) times daily as needed by mouth (pain).     [provider]  IRON PO Take 1 tablet daily by mouth.     [provider]  lisinopril (PRINIVIL,ZESTRIL) 20 MG tablet Take 1 tablet (20 mg total) by mouth daily. Office visit needed 05/13/17   Wendie Agreste, MD  Multiple Vitamins-Minerals (ADULT GUMMY PO) Take 2 tablets daily by mouth.    [provider]  ondansetron (ZOFRAN) 4 MG tablet Take 1 tablet (4 mg total) by mouth every 6 (six) hours as needed for nausea. 01/25/17   Swinteck, Aaron Edelman, MD  propranolol (INDERAL) 20 MG tablet TAKE 1 TABLET (20 MG TOTAL) BY MOUTH DAILY. OFFICE VISIT NEEDED (PER INS. 03/09/2017) 05/18/17   Wendie Agreste, MD  senna (SENOKOT) 8.6 MG TABS tablet Take 2 tablets (17.2 mg total) by mouth at bedtime. 01/25/17   Rod Can, MD   Social History   Socioeconomic History  . Marital status: Divorced     Spouse name: Not on file  . Number of children: Not on file  . Years of education: Not on file  . Highest education level: Not on file  Occupational History  . Not on file  Social Needs  . Financial resource strain: Not on file  . Food insecurity:    Worry: Not on file    Inability: Not on file  . Transportation needs:    Medical: Not on file    Non-medical: Not on file  Tobacco Use  . Smoking status: Never Smoker  . Smokeless tobacco: Never Used  Substance and Sexual Activity  . Alcohol use: Yes    Alcohol/week: 3.6 oz    Types: 6 Standard drinks or  equivalent per week    Comment: Socially  . Drug use: No  . Sexual activity: Never  Lifestyle  . Physical activity:    Days per week: Not on file    Minutes per session: Not on file  . Stress: Not on file  Relationships  . Social connections:    Talks on phone: Not on file    Gets together: Not on file    Attends religious service: Not on file    Active member of club or organization: Not on file    Attends meetings of clubs or organizations: Not on file    Relationship status: Not on file  . Intimate partner violence:    Fear of current or ex partner: Not on file    Emotionally abused: Not on file    Physically abused: Not on file    Forced sexual activity: Not on file  Other Topics Concern  . Not on file  Social History Narrative  . Not on file   Review of Systems  Constitutional: Negative for chills, fatigue, fever and unexpected weight change.  HENT: Positive for facial swelling.        Bruising around right eye  Eyes: Positive for pain.  Respiratory: Negative for cough.   Gastrointestinal: Negative for constipation, diarrhea, nausea and vomiting.  Skin: Positive for color change. Negative for rash and wound.  Neurological: Positive for dizziness, light-headedness and headaches. Negative for syncope and weakness.       Objective:   Physical Exam  HENT:  Head: Head is without Battle's sign.  Right Ear: No  hemotympanum.  Left Ear: No hemotympanum.  Nose: No nasal septal hematoma.  Face: healing ecchymosis around the right orbit, eye appears normal; some tenderness over upper orbit on right, maxilla non tender, nasal bones non tender, no obvious deformity of right orbit Ears: moderate cerumen without obstruction bilaterally  Eyes: Pupils are equal, round, and reactive to light. EOM are normal. Right eye exhibits no nystagmus. Left eye exhibits no nystagmus.  Right eye: anterior chamber is clear, no hyphema, no pain or restriction with EOMI testing  Cardiovascular: Normal rate, regular rhythm and normal heart sounds. Exam reveals no gallop and no friction rub.  No murmur heard. Abdominal: Soft. Bowel sounds are normal. She exhibits no distension. There is no tenderness.  Musculoskeletal: She exhibits no edema.    Vitals:   07/02/17 1512  BP: (!) 88/58  Pulse: 71  Temp: 98.3 F (36.8 C)  TempSrc: Oral  SpO2: 100%  Weight: 123 lb 12.8 oz (56.2 kg)  Height: 5' 5.75" (1.67 m)   Orthostatic VS for the past 24 hrs (Last 3 readings):  BP- Lying Pulse- Lying BP- Sitting Pulse- Sitting BP- Standing at 0 minutes Pulse- Standing at 0 minutes BP- Standing at 3 minutes Pulse- Standing at 3 minutes  07/02/17 1526 106/69 64 106/74 71 110/76 72 109/76 73   Results for orders placed or performed in visit on 78/29/56  Basic metabolic panel  Result Value Ref Range   Glucose 80 65 - 99 mg/dL   BUN 23 8 - 27 mg/dL   Creatinine, Ser 0.97 0.57 - 1.00 mg/dL   GFR calc non Af Amer 63 >59 mL/min/1.73   GFR calc Af Amer 73 >59 mL/min/1.73   BUN/Creatinine Ratio 24 12 - 28   Sodium 138 134 - 144 mmol/L   Potassium 4.2 3.5 - 5.2 mmol/L   Chloride 103 96 - 106 mmol/L   CO2 18 (L) 20 -  29 mmol/L   Calcium 10.0 8.7 - 10.3 mg/dL  POCT CBC  Result Value Ref Range   WBC 6.2 4.6 - 10.2 K/uL   Lymph, poc 1.6 0.6 - 3.4   POC LYMPH PERCENT 25.1 10 - 50 %L   MID (cbc) 0.2 0 - 0.9   POC MID % 2.5 0 - 12 %M   POC  Granulocyte 4.5 2 - 6.9   Granulocyte percent 72.4 37 - 80 %G   RBC 3.35 (A) 4.04 - 5.48 M/uL   Hemoglobin 11.2 (A) 12.2 - 16.2 g/dL   HCT, POC 34.0 (A) 37.7 - 47.9 %   MCV 101.5 (A) 80 - 97 fL   MCH, POC 33.4 (A) 27 - 31.2 pg   MCHC 32.9 31.8 - 35.4 g/dL   RDW, POC 13.4 %   Platelet Count, POC 307 142 - 424 K/uL   MPV 6.4 0 - 99.8 fL  POCT glucose (manual entry)  Result Value Ref Range   POC Glucose 79 70 - 99 mg/dl       Assessment & Plan:    Layni Kreamer is a 62 y.o. female Contusion of face, initial encounter  - appears to be healing ecchymosis, vision reportedly normal. soft tissue ttp, less likely facial bone fracture, but CT was offered. Declined at this time. rtc precautions if not continuing to improve.   Episodic lightheadedness - Plan: POCT glucose (manual entry) Hypotension, unspecified hypotension type  - likely overtreated HTN. Stop propanolol, decrease lisinopril to 10mg  QD. guidelines given on restarting meds and dosage adjustments, avoid alcohol for now or at least minimize use. RTC precautions given, but recheck in 1 week.   Anemia, unspecified type - Plan: POCT CBC  - minimally anemic, unlikely cause of above symptoms. Continue iron supplement.   Hypokalemia - Plan: Basic metabolic panel  - normal on recheck above.   Meds ordered this encounter  Medications  . lisinopril (PRINIVIL,ZESTRIL) 10 MG tablet    Sig: Take 1 tablet (10 mg total) by mouth daily.    Dispense:  30 tablet    Refill:  1   Patient Instructions   Injury around right eye appears to be healing.  If any pain around the bone, or you change your mind about CT scan, let me know and I am happy to order that study.  If any worsening symptoms, new headaches, or change in vision, be seen right away.  Stop propranolol for now, decrease lisinopril to 10 mg once per day.  Make sure you drink plenty of fluids throughout the day to lessen chance of lightheadedness and fainting. Make sure to  get up slowly for now to lessen risk of falling. Avoid alcohol as possible for now as that may also worsen your current symptoms.  Monitor your Casey pressure twice per day for the next few days and if any readings below 100 on the top number, we may need to hold the lisinopril altogether.  If Casey pressure is over 160/100, restart propanolol.  Although you are still anemic, your hemoglobin is much better than last year, and unlikely to be the cause of your symptoms today.  Continue iron once per day.  Please follow-up within the next 1 week to recheck Casey pressure and discuss other medications.   thank you for coming in today.  Return to the clinic or go to the nearest emergency room if any of your symptoms worsen or new symptoms occur.   Near-Syncope Near-syncope is when you  suddenly become weak or dizzy, or you feel like you might pass out (faint). During an episode of near-syncope, you may:  Feel dizzy or light-headed.  Feel nauseous.  See all white or all black in your field of vision.  Have cold, clammy skin.  This condition is caused by a sudden decrease in Casey flow to the brain. This decrease can result from various causes, but most of those causes are not dangerous. However, near-syncope can be a sign of a serious medical problem, so it is important to seek medical care. If you fainted, get medical help right away.Call your local emergency services (911 in the [place] Do not drive yourself to the hospital. Follow these instructions at home: Pay attention to any changes in your symptoms. Take these actions to help with your condition:  Have someone stay with you until you feel stable.  Do not drive, use machinery, or play sports until your health care provider says it is okay.  Keep all follow-up visits as told by your health care provider. This is important.  If you start to feel like you might faint, lie down right away and raise (elevate) your feet above the level of  your heart. Breathe deeply and steadily. Wait until all of the symptoms have passed.  Drink enough fluid to keep your urine clear or pale yellow.  If you are taking Casey pressure or heart medicine, get up slowly and take several minutes to sit and then stand. This can reduce dizziness.  Take over-the-counter and prescription medicines only as told by your health care provider.  Get help right away if:  You have a severe headache.  You have unusual pain in your chest, abdomen, or back.  You are bleeding from your mouth or rectum, or you have black or tarry stool.  You have a very fast or irregular heartbeat (palpitations).  You faint once or repeatedly.  You have a seizure.  You are confused.  You have trouble walking.  You have severe weakness.  You have vision problems. These symptoms may represent a serious problem that is an emergency. Do not wait to see if your symptoms will go away. Get medical help right away. Call your local emergency services (911 in the [place] Do not drive yourself to the hospital. This information is not intended to replace advice given to you by your health care provider. Make sure you discuss any questions you have with your health care provider. Document Released: 02/12/2005 Document Revised: 07/21/2015 Document Reviewed: 10/27/2014 Elsevier Interactive Patient Education  2017 [employer].    IF you received an x-Casey today, you will receive an invoice from [facility]. Please contact [facility] at [phone] with questions or concerns regarding your invoice.   IF you received labwork today, you will receive an invoice from [place]. Please contact [facility] at [phone] with questions or concerns regarding your invoice.   Our billing staff will not be able to assist you with questions regarding bills from these companies.  You will be contacted with the lab results as soon as they are available. The fastest way to get  your results is to activate your My Chart account. Instructions are located on the last page of this paperwork. If you have not heard from [place] regarding the results in 2 weeks, please contact this office.       I personally performed the services described in this documentation, which was scribed in my presence. The recorded information has been reviewed and  considered for accuracy and completeness, addended by me as needed, and agree with information above.  Signed,   Dawn Ray, MD [facility].  07/05/17 10:23 PM

## 2017-07-02 NOTE — Patient Instructions (Addendum)
Injury around right eye appears to be healing.  If any pain around the bone, or you change your mind about CT scan, let me know and I am happy to order that study.  If any worsening symptoms, new headaches, or change in vision, be seen right away.  Stop propranolol for now, decrease lisinopril to 10 mg once per day.  Make sure you drink plenty of fluids throughout the day to lessen chance of lightheadedness and fainting. Make sure to get up slowly for now to lessen risk of falling. Avoid alcohol as possible for now as that may also worsen your current symptoms.  Monitor your blood pressure twice per day for the next few days and if any readings below 100 on the top number, we may need to hold the lisinopril altogether.  If blood pressure is over 160/100, restart propanolol.  Although you are still anemic, your hemoglobin is much better than last year, and unlikely to be the cause of your symptoms today.  Continue iron once per day.  Please follow-up within the next 1 week to recheck blood pressure and discuss other medications.   thank you for coming in today.  Return to the clinic or go to the nearest emergency room if any of your symptoms worsen or new symptoms occur.   Near-Syncope Near-syncope is when you suddenly become weak or dizzy, or you feel like you might pass out (faint). During an episode of near-syncope, you may:  Feel dizzy or light-headed.  Feel nauseous.  See all white or all black in your field of vision.  Have cold, clammy skin.  This condition is caused by a sudden decrease in blood flow to the brain. This decrease can result from various causes, but most of those causes are not dangerous. However, near-syncope can be a sign of a serious medical problem, so it is important to seek medical care. If you fainted, get medical help right away.Call your local emergency services (911 in the [place] Do not drive yourself to the hospital. Follow these instructions at home: Pay  attention to any changes in your symptoms. Take these actions to help with your condition:  Have someone stay with you until you feel stable.  Do not drive, use machinery, or play sports until your health care provider says it is okay.  Keep all follow-up visits as told by your health care provider. This is important.  If you start to feel like you might faint, lie down right away and raise (elevate) your feet above the level of your heart. Breathe deeply and steadily. Wait until all of the symptoms have passed.  Drink enough fluid to keep your urine clear or pale yellow.  If you are taking blood pressure or heart medicine, get up slowly and take several minutes to sit and then stand. This can reduce dizziness.  Take over-the-counter and prescription medicines only as told by your health care provider.  Get help right away if:  You have a severe headache.  You have unusual pain in your chest, abdomen, or back.  You are bleeding from your mouth or rectum, or you have black or tarry stool.  You have a very fast or irregular heartbeat (palpitations).  You faint once or repeatedly.  You have a seizure.  You are confused.  You have trouble walking.  You have severe weakness.  You have vision problems. These symptoms may represent a serious problem that is an emergency. Do not wait to see if your symptoms  will go away. Get medical help right away. Call your local emergency services (911 in the [place] Do not drive yourself to the hospital. This information is not intended to replace advice given to you by your health care provider. Make sure you discuss any questions you have with your health care provider. Document Released: 02/12/2005 Document Revised: 07/21/2015 Document Reviewed: 10/27/2014 Elsevier Interactive Patient Education  2017 [employer].    IF you received an x-ray today, you will receive an invoice from [facility]. Please contact [facility] Radiology at  [phone] with questions or concerns regarding your invoice.   IF you received labwork today, you will receive an invoice from [place]. Please contact [facility] at [phone] with questions or concerns regarding your invoice.   Our billing staff will not be able to assist you with questions regarding bills from these companies.  You will be contacted with the lab results as soon as they are available. The fastest way to get your results is to activate your My Chart account. Instructions are located on the last page of this paperwork. If you have not heard from [place] regarding the results in 2 weeks, please contact this office.

## 2017-07-03 LAB — BASIC METABOLIC PANEL
BUN/Creatinine Ratio: 24 (ref 12–28)
BUN: 23 mg/dL (ref 8–27)
CALCIUM: 10 mg/dL (ref 8.7–10.3)
CHLORIDE: 103 mmol/L (ref 96–106)
CO2: 18 mmol/L — AB (ref 20–29)
Creatinine, Ser: 0.97 mg/dL (ref 0.57–1.00)
GFR, EST AFRICAN AMERICAN: 73 mL/min/{1.73_m2} (ref >59)
GFR, EST NON AFRICAN AMERICAN: 63 mL/min/{1.73_m2} (ref >59)
Glucose: 80 mg/dL (ref 65–99)
POTASSIUM: 4.2 mmol/L (ref 3.5–5.2)
SODIUM: 138 mmol/L (ref 134–144)

## 2017-07-04 ENCOUNTER — Telehealth: Payer: Self-pay | Admitting: Family Medicine

## 2017-07-04 ENCOUNTER — Ambulatory Visit: Payer: Self-pay

## 2017-07-04 NOTE — Telephone Encounter (Signed)
Patient called in with c/o "UTI." She says "everytime after I have intercourse, I get a UTI. The symptoms started this morning. I have burning with urination, pressure, frequency and pain with urination. I have been drinking cranberry juice and eating yogurt, but that didn't stop the symptoms. I have clindamycin that my orthopedic gave me to use when I go to the dentist, so I took one. The symptoms went away, other than the pressure. I wanted to know how much should I take." I advised her not to take the clindamycin, because that was not prescribed for her UTI and she would need to be seen in the office to submit a urine sample and get the preferred the antibiotic of the provider. I asked is she having a fever, she says "no fever, just a little pain with urination and pressure." According to protocol, see PCP within 24 hours, no availability with PCP, appointment scheduled for tomorrow at [facility] with Philis Fendt, PA-C, care advice given, patient verbalized understanding.  Reason for Disposition . Urinating more frequently than usual (i.e., frequency)  Answer Assessment - Initial Assessment Questions 1. SYMPTOM: "What's the main symptom you're concerned about?" (e.g., frequency, incontinence)     Burning and frequency 2. ONSET: "When did the  ________  start?"     This morning 3. PAIN: "Is there any pain?" If so, ask: "How bad is it?" (Scale: 1-10; mild, moderate, severe)     No real pain, just feeling to urinate 4. CAUSE: "What do you think is causing the symptoms?"     UTI 5. OTHER SYMPTOMS: "Do you have any other symptoms?" (e.g., fever, flank pain, blood in urine, pain with urination)     Pain with urination 6. PREGNANCY: "Is there any chance you are pregnant?" "When was your last menstrual period?"     No  Protocols used: [facility]

## 2017-07-04 NOTE — Telephone Encounter (Signed)
What kind of parameters for blood pressure would you like for me to use with patient in regards to taking blood pressure medication?

## 2017-07-04 NOTE — Telephone Encounter (Signed)
Copied from [place] [id number]. Topic: General - Other >> Jul 04, 2017  2:26 PM Dawn Casey wrote: Reason for CRM: patient states that she didn't take the BP medicine lisinopril yesterday because BP was low but today her BP was 117/74

## 2017-07-04 NOTE — Telephone Encounter (Signed)
  Ok to hold lisinopril for now and if BP over 130/80 - ok to restart lisinopril 10mg  QD.  If blood pressure is over 160/100, restart propanolol as well. Thanks.

## 2017-07-05 ENCOUNTER — Ambulatory Visit: Payer: BLUE CROSS/BLUE SHIELD | Admitting: Physician Assistant

## 2017-07-05 ENCOUNTER — Encounter: Payer: Self-pay | Admitting: Physician Assistant

## 2017-07-05 ENCOUNTER — Other Ambulatory Visit: Payer: Self-pay

## 2017-07-05 VITALS — BP 90/62 | HR 84 | Temp 98.7°F | Resp 16 | Ht 65.75 in | Wt 123.8 lb

## 2017-07-05 DIAGNOSIS — M25551 Pain in right hip: Secondary | ICD-10-CM

## 2017-07-05 DIAGNOSIS — R300 Dysuria: Secondary | ICD-10-CM

## 2017-07-05 DIAGNOSIS — N3000 Acute cystitis without hematuria: Secondary | ICD-10-CM

## 2017-07-05 DIAGNOSIS — I959 Hypotension, unspecified: Secondary | ICD-10-CM

## 2017-07-05 LAB — POCT URINALYSIS DIP (MANUAL ENTRY)
Bilirubin, UA: NEGATIVE
GLUCOSE UA: NEGATIVE mg/dL
Ketones, POC UA: NEGATIVE mg/dL
Leukocytes, UA: NEGATIVE
Nitrite, UA: NEGATIVE
PH UA: 6.5 (ref 5.0–8.0)
Protein Ur, POC: NEGATIVE mg/dL
RBC UA: NEGATIVE
SPEC GRAV UA: 1.01 (ref 1.010–1.025)
Urobilinogen, UA: 0.2 E.U./dL

## 2017-07-05 LAB — POC MICROSCOPIC URINALYSIS (UMFC): Mucus: ABSENT

## 2017-07-05 MED ORDER — NITROFURANTOIN MONOHYD MACRO 100 MG PO CAPS: 100.0000 mg | ORAL_CAPSULE | Freq: Two times a day (BID) | ORAL | 0 refills | Status: AC

## 2017-07-05 NOTE — Progress Notes (Signed)
poct

## 2017-07-05 NOTE — Patient Instructions (Addendum)
  Stop your lisinopril for now.  Please make sure that you are drinking lots of water.  Watch out for low blood pressure after exercising.   IF you received an x-ray today, you will receive an invoice from [facility]. Please contact [facility]iology at [phone] with questions or concerns regarding your invoice.   IF you received labwork today, you will receive an invoice from [place]. Please contact [facility] at [phone] with questions or concerns regarding your invoice.   Our billing staff will not be able to assist you with questions regarding bills from these companies.  You will be contacted with the lab results as soon as they are available. The fastest way to get your results is to activate your My Chart account. Instructions are located on the last page of this paperwork. If you have not heard from [place] regarding the results in 2 weeks, please contact this office.

## 2017-07-05 NOTE — Progress Notes (Signed)
07/05/2017 10:32 AM   DOB: 28-Mar-1955 / MRN: [id number]  SUBJECTIVE:  Dawn Herandez is a 62 y.o. female presenting for burning with urination that started yesterday.  Patient has a standing prescription for clindamycin given previous hip operation and she did take 2 clindamycin tabs yesterday and tells me this helped some.  She complains of some ongoing dizziness and has seen Dr. Nyoka Cowden in the last week or so for this.  He advised she stop taking beta-blockade and continue ACE inhibitor.  She continues to feel dizzy.  She has no history of diabetes, renal dysfunction, cardiac disease.  She has lost roughly 15 pounds since hip surgery.  She does complain of right sided hip pain that started after a fall.  She is moving well and denies weakness about the joint.  She complains of some numbness that started  just after the surgery.  She feels that the numbness is normal given the scope of her operation.  She is allergic to sulfa antibiotics; tramadol; prozac [fluoxetine hcl]; adhesive [tape]; amoxicillin; and erythromycin.   She  has a past medical history of Allergy, Anxiety, Degenerative joint disease (DJD) of lumbar spine, Hip arthritis, Hypertension, and Skin cancer (08/12/2015).    She  reports that she has never smoked. She has never used smokeless tobacco. She reports that she drinks about 3.6 oz of alcohol per week. She reports that she does not use drugs. She  reports that she does not engage in sexual activity. The patient  has a past surgical history that includes Appendectomy; Excision basal cell carcinoma (08/17/2015); and Total hip arthroplasty (Right, 01/24/2017).  Her family history includes Diabetes in her father; Hypertension in her father and mother.  Review of Systems  Constitutional: Negative for diaphoresis.  Eyes: Negative.   Respiratory: Negative for cough, hemoptysis, sputum production, shortness of breath and wheezing.   Cardiovascular: Negative for chest pain,  orthopnea and leg swelling.  Gastrointestinal: Negative for abdominal pain, blood in stool, constipation, diarrhea, heartburn, melena, nausea and vomiting.  Genitourinary: Positive for dysuria. Negative for flank pain and hematuria.  Neurological: Negative for dizziness, tingling, tremors, sensory change, speech change, focal weakness, seizures, loss of consciousness, weakness and headaches.    The problem list and medications were reviewed and updated by myself where necessary and exist elsewhere in the encounter.   OBJECTIVE:  BP 90/62 (BP Location: Left Arm, Patient Position: Sitting, Cuff Size: Normal)   Pulse 84   Temp 98.7 F (37.1 C) (Oral)   Resp 16   Ht 5' 5.75" (1.67 m)   Wt 123 lb 12.8 oz (56.2 kg)   SpO2 100%   BMI 20.13 kg/m   Physical Exam  Constitutional: She is oriented to person, place, and time. She appears well-developed.  Eyes: Pupils are equal, round, and reactive to light. EOM are normal.  Cardiovascular: Normal rate.  Pulmonary/Chest: Effort normal.  Abdominal: She exhibits no distension.  Musculoskeletal: Normal range of motion.  Neurological: She is alert and oriented to person, place, and time. No cranial nerve deficit.  Skin: Skin is warm and dry. She is not diaphoretic.  Psychiatric: She has a normal mood and affect.  Vitals reviewed.   Results for orders placed or performed in visit on 07/05/17 (from the past 72 hour(s))  POCT urinalysis dipstick     Status: None   Collection Time: 07/05/17  9:50 AM  Result Value Ref Range   Color, UA yellow yellow   Clarity, UA clear clear  Glucose, UA negative negative mg/dL   Bilirubin, UA negative negative   Ketones, POC UA negative negative mg/dL   Spec Grav, UA 1.010 1.010 - 1.025   Blood, UA negative negative   pH, UA 6.5 5.0 - 8.0   Protein Ur, POC negative negative mg/dL   Urobilinogen, UA 0.2 0.2 or 1.0 E.U./dL   Nitrite, UA Negative Negative   Leukocytes, UA Negative Negative  POCT Microscopic  Urinalysis ([facility])     Status: Abnormal   Collection Time: 07/05/17  9:52 AM  Result Value Ref Range   WBC,UR,HPF,POC Many (A) None WBC/hpf   RBC,UR,HPF,POC None None RBC/hpf   Bacteria Few (A) None, Too numerous to count   Mucus Absent Absent   Epithelial Cells, UR Per Microscopy None None, Too numerous to count cells/hpf   Lab Results  Component Value Date   WBC 6.2 07/02/2017   HGB 11.2 (A) 07/02/2017   HCT 34.0 (A) 07/02/2017   MCV 101.5 (A) 07/02/2017   PLT 199 01/25/2017    CBC Latest Ref Rng & Units 07/02/2017 01/25/2017 01/24/2017  WBC 4.6 - 10.2 K/uL 6.2 8.9 -  Hemoglobin 12.2 - 16.2 g/dL 11.2(A) 8.7(L) 7.8(L)  Hematocrit 37.7 - 47.9 % 34.0(A) 25.8(L) 23.0(L)  Platelets 150 - 400 K/uL - 199 -      No results found.  ASSESSMENT AND PLAN:  [place] was seen today for urinary tract infection.  Diagnoses and all orders for this visit:  Dysuria: -     POCT urinalysis dipstick -     POCT Microscopic Urinalysis ([facility]) -     Urine Culture -     Orthostatic vital signs  Pain of right hip joint her gait is normal.  Is got a good hip exam today.  I see no indication for follow-up with surgery or imaging at this time.  Advised that she try to get some exercise on her new treadmill.  Hypotension, unspecified hypotension type: She will hold her lisinopril per Dr. Rolly Salter most recent instructions.  She is now off of beta-blockade.  Hypertension most likely secondary  to weight loss and exacerbated by medication.:  Acute cystitis without hematuria -     nitrofurantoin, macrocrystal-monohydrate, (MACROBID) 100 MG capsule; Take 1 capsule (100 mg total) by mouth 2 (two) times daily for 5 days.    The patient is advised to call or return to clinic if she does not see an improvement in symptoms, or to seek the care of the closest emergency department if she worsens with the above plan.   Philis Fendt, MHS, PA-C [facility] 07/05/2017 10:32  AM

## 2017-07-05 NOTE — Progress Notes (Signed)
    07/05/2017 10:18 AM   DOB: 1956/01/11 / MRN: [id number]  SUBJECTIVE:  Dawn Casey is a 62 y.o. female presenting for   She is allergic to sulfa antibiotics; tramadol; prozac [fluoxetine hcl]; adhesive [tape]; amoxicillin; and erythromycin.   She  has a past medical history of Allergy, Anxiety, Degenerative joint disease (DJD) of lumbar spine, Hip arthritis, Hypertension, and Skin cancer (08/12/2015).    She  reports that she has never smoked. She has never used smokeless tobacco. She reports that she drinks about 3.6 oz of alcohol per week. She reports that she does not use drugs. She  reports that she does not engage in sexual activity. The patient  has a past surgical history that includes Appendectomy; Excision basal cell carcinoma (08/17/2015); and Total hip arthroplasty (Right, 01/24/2017).  Her family history includes Diabetes in her father; Hypertension in her father and mother.  ROS  The problem list and medications were reviewed and updated by myself where necessary and exist elsewhere in the encounter.   OBJECTIVE:  BP 90/62 (BP Location: Left Arm, Patient Position: Sitting, Cuff Size: Normal)   Pulse 84   Temp 98.7 F (37.1 C) (Oral)   Resp 16   Ht 5' 5.75" (1.67 m)   Wt 123 lb 12.8 oz (56.2 kg)   SpO2 100%   BMI 20.13 kg/m   BP Readings from Last 3 Encounters:  07/05/17 90/62  07/02/17 109/76  01/25/17 103/65     Physical Exam  Results for orders placed or performed in visit on 07/05/17 (from the past 72 hour(s))  POCT urinalysis dipstick     Status: None   Collection Time: 07/05/17  9:50 AM  Result Value Ref Range   Color, UA yellow yellow   Clarity, UA clear clear   Glucose, UA negative negative mg/dL   Bilirubin, UA negative negative   Ketones, POC UA negative negative mg/dL   Spec Grav, UA 1.010 1.010 - 1.025   Blood, UA negative negative   pH, UA 6.5 5.0 - 8.0   Protein Ur, POC negative negative mg/dL   Urobilinogen, UA 0.2 0.2 or 1.0  E.U./dL   Nitrite, UA Negative Negative   Leukocytes, UA Negative Negative  POCT Microscopic Urinalysis ([facility])     Status: Abnormal   Collection Time: 07/05/17  9:52 AM  Result Value Ref Range   WBC,UR,HPF,POC Many (A) None WBC/hpf   RBC,UR,HPF,POC None None RBC/hpf   Bacteria Few (A) None, Too numerous to count   Mucus Absent Absent   Epithelial Cells, UR Per Microscopy None None, Too numerous to count cells/hpf    No results found.  ASSESSMENT AND PLAN:  Berenice was seen today for urinary tract infection.  Diagnoses and all orders for this visit:  Dysuria -     POCT urinalysis dipstick -     POCT Microscopic Urinalysis ([facility]) -     Urine Culture    The patient is advised to call or return to clinic if she does not see an improvement in symptoms, or to seek the care of the closest emergency department if she worsens with the above plan.   Philis Fendt, MHS, PA-C [facility] 07/05/2017 10:18 AM

## 2017-07-05 NOTE — Telephone Encounter (Signed)
LVM for pt.

## 2017-07-06 ENCOUNTER — Telehealth: Payer: Self-pay | Admitting: Family Medicine

## 2017-07-06 LAB — URINE CULTURE

## 2017-07-06 NOTE — Telephone Encounter (Signed)
Pt. Called with health concerns relating to blood pressure and pulse. Transferred to [employer]

## 2017-07-06 NOTE — Telephone Encounter (Signed)
Incoming call from patient. She states that she is concerned about her heart rate, which is 99. Patient advised blood pressure just taken (118/78) within normal limits as well. She states she is also concerned because she can feel her heart beating. She denies any current headache, palpitations, shortness of breath, chest pain, and visual changes. Patient advised to call anytime if she develops these symptoms, she verbalizes understanding.

## 2017-07-09 ENCOUNTER — Telehealth: Payer: Self-pay | Admitting: Family Medicine

## 2017-07-09 NOTE — Telephone Encounter (Signed)
Copied from [place] [id number]. Topic: Quick Communication - See Telephone Encounter >> Jul 09, 2017  1:40 PM Selinda Flavin B, NT wrote: CRM for notification. See Telephone encounter for: 07/09/17. Patient calling to see if the antibiotic for her UTI is needed once she had completed the medication. States she did not know what was found on her urine culture that would indicate if she needed to take medication longer. Please advise. CB#: [phone]

## 2017-07-09 NOTE — Telephone Encounter (Signed)
Phone call to patient, reviewed lab results. She verbalizes understanding. She states she feels like she may need another day of antibiotics, doesn't want to have a gap day where is isn't covered. Has an appointment this Thursday 5/16 with Dr. Carlota Raspberry. Discussed use of antibiotics for UTI with patient. She states she will discuss this further with Dr. Carlota Raspberry at her upcoming appointment.

## 2017-07-11 ENCOUNTER — Encounter: Payer: Self-pay | Admitting: Family Medicine

## 2017-07-11 ENCOUNTER — Ambulatory Visit (INDEPENDENT_AMBULATORY_CARE_PROVIDER_SITE_OTHER): Payer: BLUE CROSS/BLUE SHIELD | Admitting: Family Medicine

## 2017-07-11 VITALS — BP 102/80 | HR 75 | Temp 98.1°F | Ht 64.0 in | Wt 124.2 lb

## 2017-07-11 DIAGNOSIS — R300 Dysuria: Secondary | ICD-10-CM

## 2017-07-11 DIAGNOSIS — I10 Essential (primary) hypertension: Secondary | ICD-10-CM | POA: Diagnosis not present

## 2017-07-11 DIAGNOSIS — F418 Other specified anxiety disorders: Secondary | ICD-10-CM

## 2017-07-11 NOTE — Progress Notes (Addendum)
Subjective:  By signing my name below, I, Essence Howell, attest that this documentation has been prepared under the direction and in the presence of Wendie Agreste, MD Electronically Signed: Ladene Artist, ED Scribe 07/11/2017 at 2:53 PM.   Patient ID: [name], female    DOB: [birth year], 62 y.o.   MRN: [id number]  Chief Complaint  Patient presents with  . Urinary Tract Infection    follow up   . Hypotension    follow up   HPI Nikki Glanzer is a 62 y.o. female who presents to Primary Care at [facility] for f/u.  Relative Hypotension Seen 5/7. BP 107/76 in the office, however, she did have significantly lower readings at home along with a fall that was likely due to drop in BP. We stopped propranolol, decreased lisinopril to 10 mg qd depending on home readings. Phone note on 5/9, home reading off all meds was 117/74. Option of remaining off all meds unless over 130/80. Phone note also reviewed from 5/11 regarding possible palpitations. - Pt reports morning systolic reading of 341, but states it rose to 131 around 1 PM today so she took 10 mg Lisinopril. States she only takes 1 tab every other day when her home reading is over 130/80. Denies any more palpitations since 5/11. Drinks ~1 cup of caffeine in the morning.  Dysuria/UTI Seen by Philis Fendt on 5/10. Started on Macrobid 100 mg bid x 5 days. Urine culture was neg with only mixed urogenital flora. - Pt states she is still having very mild "twinge" at the end of stream but denies urinary frequency or any other symptoms at this time. She states that both her and her partner have been screened recently for STIs and have been using condoms each time.  Bump on L Middle Finger Pt also presents with a non-tender bump to the L middle finger which she first noticed 1 wk ago. States she has a similar bump to the R ankle as well.  Patient Active Problem List   Diagnosis Date Noted  . History of alcohol abuse 03/20/2016  . Other  depressive episodes 03/20/2016  . Degenerative joint disease 03/20/2016  . Osteoarthritis of right hip 01/24/2016  . Generalized anxiety disorder 01/16/2014  . Anemia 01/16/2014  . Essential hypertension 01/16/2014   Past Medical History:  Diagnosis Date  . Allergy   . Anxiety   . Degenerative joint disease (DJD) of lumbar spine   . Hip arthritis    Per rads.   . Hypertension   . Skin cancer 08/12/2015   Per patient's report.    Past Surgical History:  Procedure Laterality Date  . APPENDECTOMY    . BASAL CELL CARCINOMA EXCISION  08/17/2015   From face  . TOTAL HIP ARTHROPLASTY Right 01/24/2017   Procedure: RIGHT TOTAL HIP ARTHROPLASTY ANTERIOR APPROACH;  Surgeon: Rod Can, MD;  Location: [facility];  Service: Orthopedics;  Laterality: Right;  Needs RNFA   Allergies  Allergen Reactions  . Sulfa Antibiotics Rash  . Tramadol     Causes vomiting and bleeding esophageal  . Prozac [Fluoxetine Hcl] Hives  . Adhesive [Tape] Rash  . Amoxicillin Rash    Has patient had a PCN reaction causing immediate rash, facial/tongue/throat swelling, SOB or lightheadedness with hypotension: Unknown Has patient had a PCN reaction causing severe rash involving mucus membranes or skin necrosis: Unknown Has patient had a PCN reaction that required hospitalization: Unknown Has patient had a PCN reaction occurring within the last 10  years: No If all of the above answers are "NO", then may proceed with Cephalosporin use.   . Erythromycin Rash   Prior to Admission medications   Medication Sig Start Date End Date Taking? Authorizing Provider  ALPRAZolam Duanne Moron) 0.5 MG tablet TAKE 1/2 TO 1 TABLETS BY MOUTH TWICE A DAY AS NEEDED FOR ANXIETY 05/18/17  Yes Wendie Agreste, MD  aspirin 81 MG chewable tablet Chew 1 tablet (81 mg total) by mouth 2 (two) times daily. 01/25/17  Yes [place], Aaron Edelman, MD  BIOTIN PO Take 3 each daily by mouth. Hair,  skin and nails gummy   Yes [provider]    clindamycin (CLEOCIN) 150 MG capsule TAKE 4 CAPSULES BY MOUTH 1 HOUR PRIOR TO APPOINTMENT for Dental work 06/04/17  Yes [provider]  Ibuprofen-Famotidine (DUEXIS) 800-26.6 MG TABS Take 1 tablet 3 (three) times daily as needed by mouth (pain).    Yes [provider]  IRON PO Take 1 tablet daily by mouth.    Yes [provider]  lisinopril (PRINIVIL,ZESTRIL) 10 MG tablet Take 1 tablet (10 mg total) by mouth daily. 07/02/17  Yes Wendie Agreste, MD  Multiple Vitamins-Minerals (ADULT GUMMY PO) Take 2 tablets daily by mouth.   Yes [provider]   Social History   Socioeconomic History  . Marital status: Divorced    Spouse name: Not on file  . Number of children: Not on file  . Years of education: Not on file  . Highest education level: Not on file  Occupational History  . Not on file  Social Needs  . Financial resource strain: Not on file  . Food insecurity:    Worry: Not on file    Inability: Not on file  . Transportation needs:    Medical: Not on file    Non-medical: Not on file  Tobacco Use  . Smoking status: Never Smoker  . Smokeless tobacco: Never Used  Substance and Sexual Activity  . Alcohol use: Yes    Alcohol/week: 3.6 oz    Types: 6 Standard drinks or equivalent per week    Comment: Socially  . Drug use: No  . Sexual activity: Never  Lifestyle  . Physical activity:    Days per week: Not on file    Minutes per session: Not on file  . Stress: Not on file  Relationships  . Social connections:    Talks on phone: Not on file    Gets together: Not on file    Attends religious service: Not on file    Active member of club or organization: Not on file    Attends meetings of clubs or organizations: Not on file    Relationship status: Not on file  . Intimate partner violence:    Fear of current or ex partner: Not on file    Emotionally abused: Not on file    Physically abused: Not on file    Forced sexual activity: Not on file   Other Topics Concern  . Not on file  Social History Narrative  . Not on file   Review of Systems  Cardiovascular: Palpitations: resolved.  Genitourinary: Negative for dysuria and frequency.  Skin:       + Bump to L middle finger and R ankle      Objective:   Physical Exam  Constitutional: She is oriented to person, place, and time. She appears well-developed and well-nourished. No distress.  HENT:  Head: Normocephalic and atraumatic.  Eyes: Conjunctivae  and EOM are normal.  Neck: Neck supple. No tracheal deviation present.  Cardiovascular: Normal rate and regular rhythm.  Pulmonary/Chest: Effort normal and breath sounds normal. No respiratory distress.  Abdominal: There is tenderness (minimal) in the suprapubic area.  Minimal sensitivity in the suprapubic only.  Musculoskeletal: Normal range of motion.  Neurological: She is alert and oriented to person, place, and time.  Skin: Skin is warm and dry.  L middle finger: slight firm nodular area just proximal to the 3rd DIP. No erythema.  Psychiatric: She has a normal mood and affect. Her behavior is normal.  Nursing note and vitals reviewed.  Vitals:   07/11/17 1429  BP: 102/80  Pulse: 75  Temp: 98.1 F (36.7 C)  TempSrc: Oral  SpO2: 100%  Weight: 124 lb 3.2 oz (56.3 kg)  Height: 5\' 4"  (1.626 m)       Assessment & Plan:  Maral Lampe is a 62 y.o. female Dysuria - Plan: POCT urinalysis dipstick, POCT Microscopic Urinalysis ([facility])  -Improved symptoms, status post Macrobid, urine culture reassuring.  Did discuss repeat testing, possible STI testing/wet prep but deferred at this time.  RTC precautions if symptomatic.   Essential hypertension  -Overall stable, suspect she will do better with 5 mg of lisinopril once per day instead of every other day dosing of 10.  Monitor home readings and RTC precautions.  If more frequent palpitations would consider restarting propanolol and may need to stop lisinopril for blood  pressure control at that time.  Situational anxiety  -Alprazolam if needed, RTC precautions if more persistent need.  No orders of the defined types were placed in this encounter.  Patient Instructions   Try splitting 10mg  lisinopril in half - 5mg  once per day. Keep a record of your blood pressures outside of the office.   If palpitations or fast heart rate returns, we can discuss further workup.  If elevated heart rate is more persistent, can change from lisinopril to propanolol.   If urinary symptoms return. We can check other testing as discussed. Return to the clinic or go to the nearest emergency room if any of your symptoms worsen or new symptoms occur.  Alprazolam if needed, but please follow up prior to that med running out.   Thanks for coming in today.     IF you received an x-ray today, you will receive an invoice from [facility]. Please contact [facility] at [phone] with questions or concerns regarding your invoice.   IF you received labwork today, you will receive an invoice from [facility]. Please contact [facility] at [phone] with questions or concerns regarding your invoice.   Our billing staff will not be able to assist you with questions regarding bills from these companies.  You will be contacted with the lab results as soon as they are available. The fastest way to get your results is to activate your My Chart account. Instructions are located on the last page of this paperwork. If you have not heard from [place] regarding the results in 2 weeks, please contact this office.       I personally performed the services described in this documentation, which was scribed in my presence. The recorded information has been reviewed and considered for accuracy and completeness, addended by me as needed, and agree with information above.  Signed,   [name], MD [facility].  07/11/17 6:19 PM

## 2017-07-11 NOTE — Patient Instructions (Addendum)
Try splitting 10mg  lisinopril in half - 5mg  once per day. Keep a record of your blood pressures outside of the office.   If palpitations or fast heart rate returns, we can discuss further workup.  If elevated heart rate is more persistent, can change from lisinopril to propanolol.   If urinary symptoms return. We can check other testing as discussed. Return to the clinic or go to the nearest emergency room if any of your symptoms worsen or new symptoms occur.  Alprazolam if needed, but please follow up prior to that med running out.   Thanks for coming in today.     IF you received an x-ray today, you will receive an invoice from [facility]. Please contact [facility] at [phone] with questions or concerns regarding your invoice.   IF you received labwork today, you will receive an invoice from [facility]. Please contact [facility] at [phone] with questions or concerns regarding your invoice.   Our billing staff will not be able to assist you with questions regarding bills from these companies.  You will be contacted with the lab results as soon as they are available. The fastest way to get your results is to activate your My Chart account. Instructions are located on the last page of this paperwork. If you have not heard from [place] regarding the results in 2 weeks, please contact this office.

## 2017-07-19 ENCOUNTER — Other Ambulatory Visit: Payer: Self-pay

## 2017-07-19 ENCOUNTER — Ambulatory Visit: Payer: BLUE CROSS/BLUE SHIELD | Admitting: Family Medicine

## 2017-07-19 ENCOUNTER — Encounter: Payer: Self-pay | Admitting: Family Medicine

## 2017-07-19 VITALS — BP 114/82 | HR 80 | Temp 99.0°F | Ht 64.5 in | Wt 121.8 lb

## 2017-07-19 DIAGNOSIS — R21 Rash and other nonspecific skin eruption: Secondary | ICD-10-CM

## 2017-07-19 MED ORDER — TRIAMCINOLONE ACETONIDE 0.1 % EX CREA: 1.0000 "application " | TOPICAL_CREAM | Freq: Two times a day (BID) | CUTANEOUS | 0 refills | Status: DC

## 2017-07-19 NOTE — Patient Instructions (Addendum)
   IF you received an x-ray today, you will receive an invoice from [facility]. Please contact [facility] at [phone] with questions or concerns regarding your invoice.   IF you received labwork today, you will receive an invoice from [facility]. Please contact [facility] at [phone] with questions or concerns regarding your invoice.   Our billing staff will not be able to assist you with questions regarding bills from these companies.  You will be contacted with the lab results as soon as they are available. The fastest way to get your results is to activate your My Chart account. Instructions are located on the last page of this paperwork. If you have not heard from us regarding the results in 2 weeks, please contact this office.     Rash A rash is a change in the color of the skin. A rash can also change the way your skin feels. There are many different conditions and factors that can cause a rash. Follow these instructions at home: Pay attention to any changes in your symptoms. Follow these instructions to help with your condition: Medicine Take or apply over-the-counter and prescription medicines only as told by your doctor. These may include:  Corticosteroid cream.  Anti-itch lotions.  Oral antihistamines.  Skin Care  Put cool compresses on the affected areas.  Try taking a bath with: ? Epsom salts. Follow the instructions on the packaging. You can get these at your local pharmacy or grocery store. ? Baking soda. Pour a small amount into the bath as told by your doctor. ? Colloidal oatmeal. Follow the instructions on the packaging. You can get this at your local pharmacy or grocery store.  Try putting baking soda paste onto your skin. Stir water into baking soda until it gets like a paste.  Do not scratch or rub your skin.  Avoid covering the rash. Make sure the rash is exposed to air as much as possible. General instructions  Avoid hot  showers or baths, which can make itching worse. A cold shower may help.  Avoid scented soaps, detergents, and perfumes. Use gentle soaps, detergents, perfumes, and other cosmetic products.  Avoid anything that causes your rash. Keep a journal to help track what causes your rash. Write down: ? What you eat. ? What cosmetic products you use. ? What you drink. ? What you wear. This includes jewelry.  Keep all follow-up visits as told by your doctor. This is important. Contact a doctor if:  You sweat at night.  You lose weight.  You pee (urinate) more than normal.  You feel weak.  You throw up (vomit).  Your skin or the whites of your eyes look yellow (jaundice).  Your skin: ? Tingles. ? Is numb.  Your rash: ? Does not go away after a few days. ? Gets worse.  You are: ? More thirsty than normal. ? More tired than normal.  You have: ? New symptoms. ? Pain in your belly (abdomen). ? A fever. ? Watery poop (diarrhea). Get help right away if:  Your rash covers all or most of your body. The rash may or may not be painful.  You have blisters that: ? Are on top of the rash. ? Grow larger. ? Grow together. ? Are painful. ? Are inside your nose or mouth.  You have a rash that: ? Looks like purple pinprick-sized spots all over your body. ? Has a "bull's eye" or looks like a target. ? Is red and painful, causes   your skin to peel, and is not from being in the sun too long. This information is not intended to replace advice given to you by your health care provider. Make sure you discuss any questions you have with your health care provider. Document Released: 08/01/2007 Document Revised: 07/21/2015 Document Reviewed: 06/30/2014 Elsevier Interactive Patient Education  2018 Elsevier Inc.  

## 2017-07-19 NOTE — Progress Notes (Signed)
5/24/201911:44 AM  [name] 15-Jan-1956, 62 y.o. female [id number]  Chief Complaint  Patient presents with  . Rash    thinks she may have staph infection that is starting to spread on the arms for about 3 days. Started as coupe bumps and starting to spread. Started new antibiotic that may be the cause    HPI:   Patient is a 62 y.o. female who presents today for concerns of rash on arms that started 3 days ago. She states it started as 3 small bumps, solid, no drainage or pus noted. She denies any itchiness, warmth, redness or tenderness to area. She is worried about a staph infection.  She denies any new products, soaps, lotions,perfumes, detergents, etc. She denies any recent URI sx, fever, chills, malaise, joint swelling.  She has been applying bactroban She states today it is better  Fall Risk  07/19/2017 07/11/2017 07/05/2017 07/02/2017 02/10/2016  Falls in the past year? No Yes Yes Yes Yes  Number falls in past yr: - 2 or more 1 2 or more -  Injury with Fall? - Yes Yes Yes -  Comment - - hurt face, right leg and hip - -  Risk Factor Category  - - - High Fall Risk -  Risk for fall due to : - - - History of fall(s) -  Follow up - - - Falls prevention discussed -     Depression screen [facility] 2/9 07/19/2017 07/11/2017 07/05/2017  Decreased Interest 0 0 0  Down, Depressed, Hopeless 0 0 0  PHQ - 2 Score 0 0 0    Allergies  Allergen Reactions  . Sulfa Antibiotics Rash  . Tramadol     Causes vomiting and bleeding esophageal  . Prozac [Fluoxetine Hcl] Hives  . Adhesive [Tape] Rash  . Amoxicillin Rash    Has patient had a PCN reaction causing immediate rash, facial/tongue/throat swelling, SOB or lightheadedness with hypotension: Unknown Has patient had a PCN reaction causing severe rash involving mucus membranes or skin necrosis: Unknown Has patient had a PCN reaction that required hospitalization: Unknown Has patient had a PCN reaction occurring within the last 10 years:  No If all of the above answers are "NO", then may proceed with Cephalosporin use.   . Erythromycin Rash    Prior to Admission medications   Medication Sig Start Date End Date Taking? Authorizing Provider  ALPRAZolam Duanne Moron) 0.5 MG tablet TAKE 1/2 TO 1 TABLETS BY MOUTH TWICE A DAY AS NEEDED FOR ANXIETY 05/18/17  Yes Wendie Agreste, MD  BIOTIN PO Take 3 each daily by mouth. Hair,  skin and nails gummy   Yes [provider]  clindamycin (CLEOCIN) 150 MG capsule TAKE 4 CAPSULES BY MOUTH 1 HOUR PRIOR TO APPOINTMENT for Dental work 06/04/17  Yes [provider]  Ibuprofen-Famotidine (DUEXIS) 800-26.6 MG TABS Take 1 tablet 3 (three) times daily as needed by mouth (pain).    Yes [provider]  IRON PO Take 1 tablet daily by mouth.    Yes [provider]  lisinopril (PRINIVIL,ZESTRIL) 10 MG tablet Take 1 tablet (10 mg total) by mouth daily. 07/02/17  Yes Wendie Agreste, MD  Multiple Vitamins-Minerals (ADULT GUMMY PO) Take 2 tablets daily by mouth.   Yes [provider]  aspirin 81 MG chewable tablet Chew 1 tablet (81 mg total) by mouth 2 (two) times daily. Patient not taking: Reported on 07/19/2017 01/25/17   Rod Can, MD    Past Medical History:  Diagnosis Date  . Allergy   . Anxiety   . Degenerative joint disease (DJD) of lumbar spine   . Hip arthritis    Per rads.   . Hypertension   . Skin cancer 08/12/2015   Per patient's report.     Past Surgical History:  Procedure Laterality Date  . APPENDECTOMY    . BASAL CELL CARCINOMA EXCISION  08/17/2015   From face  . TOTAL HIP ARTHROPLASTY Right 01/24/2017   Procedure: RIGHT TOTAL HIP ARTHROPLASTY ANTERIOR APPROACH;  Surgeon: Rod Can, MD;  Location: [facility];  Service: Orthopedics;  Laterality: Right;  Needs RNFA    Social History   Tobacco Use  . Smoking status: Never Smoker  . Smokeless tobacco: Never Used  Substance Use Topics  . Alcohol use: Yes    Alcohol/week: 3.6 oz     Types: 6 Standard drinks or equivalent per week    Comment: Socially    Family History  Problem Relation Age of Onset  . Hypertension Mother   . Diabetes Father   . Hypertension Father     ROS Per hpi  OBJECTIVE:  Blood pressure 114/82, pulse 80, temperature 99 F (37.2 C), temperature source Oral, height 5' 4.5" (1.638 m), weight 121 lb 12.8 oz (55.2 kg), SpO2 99 %.  Physical Exam  Constitutional: She is oriented to person, place, and time. She appears well-developed and well-nourished.  HENT:  Head: Normocephalic and atraumatic.  Mouth/Throat: Mucous membranes are normal.  Eyes: Pupils are equal, round, and reactive to light. Conjunctivae and EOM are normal. No scleral icterus.  Neck: Neck supple.  Pulmonary/Chest: Effort normal.  Neurological: She is alert and oriented to person, place, and time.  Skin: Skin is warm and dry. Rash (very faint macular papular rash over forearms) noted.  Psychiatric: She has a normal mood and affect.  Nursing note and vitals reviewed.   ASSESSMENT and PLAN 1. Rash and nonspecific skin eruption Discussed supportive measures, new meds r/se/b and RTC precautions. Patient educational handout given. - triamcinolone cream (KENALOG) 0.1 %; Apply 1 application topically 2 (two) times daily.  Return if symptoms worsen or fail to improve.    Rutherford Guys, MD Primary Care at [place] [facility] [place] [place], [place] [address] Ph.  [phone] Fax [phone]

## 2017-08-05 ENCOUNTER — Encounter: Payer: Self-pay | Admitting: Family Medicine

## 2017-08-15 ENCOUNTER — Other Ambulatory Visit: Payer: Self-pay | Admitting: Family Medicine

## 2017-08-15 DIAGNOSIS — I10 Essential (primary) hypertension: Secondary | ICD-10-CM

## 2017-08-15 NOTE — Telephone Encounter (Signed)
Dr. Carlota Raspberry please advise this medication is not on pt med list

## 2017-08-16 NOTE — Telephone Encounter (Signed)
Patient called and asked about Propranolol. She says "I still use it as needed. I have plenty right now and don't need the refill." I advised I will refuse the request.

## 2017-11-12 DIAGNOSIS — H3561 Retinal hemorrhage, right eye: Secondary | ICD-10-CM | POA: Diagnosis not present

## 2017-11-12 DIAGNOSIS — D3122 Benign neoplasm of left retina: Secondary | ICD-10-CM | POA: Diagnosis not present

## 2018-01-17 ENCOUNTER — Other Ambulatory Visit: Payer: Self-pay | Admitting: Family Medicine

## 2018-01-17 DIAGNOSIS — F4323 Adjustment disorder with mixed anxiety and depressed mood: Secondary | ICD-10-CM

## 2018-01-17 NOTE — Telephone Encounter (Signed)
Requested medication (s) are due for refill today: yes  Requested medication (s) are on the active medication list: yes    Last refill: 05/18/17  #30  0 refills  Future visit scheduled no  Notes to clinic:not delegated  Requested Prescriptions  Pending Prescriptions Disp Refills   ALPRAZolam (XANAX) 0.5 MG tablet [Pharmacy Med Name: ALPRAZOLAM 0.5 MG TABLET] 30 tablet     Sig: TAKE 1/2 TO 1 TABLETS BY MOUTH TWICE A DAY AS NEEDED FOR ANXIETY     Not Delegated - Psychiatry:  Anxiolytics/Hypnotics Failed - 01/17/2018  1:19 PM      Failed - This refill cannot be delegated      Failed - Urine Drug Screen completed in last 360 days.      Failed - Valid encounter within last 6 months    Recent Outpatient Visits          6 months ago Rash and nonspecific skin eruption   [facility], Lilia Argue, MD   6 months ago Dysuria   [facility], Ranell Patrick, MD   6 months ago Dysuria   [facility] at [place], PA-C   6 months ago Contusion of face, initial encounter   [facility], Ranell Patrick, MD   1 year ago Annual physical exam   [facility], Ranell Patrick, MD

## 2018-01-21 NOTE — Telephone Encounter (Signed)
Patient is requesting a refill of the following medications: Requested Prescriptions   Pending Prescriptions Disp Refills  . ALPRAZolam (XANAX) 0.5 MG tablet [Pharmacy Med Name: ALPRAZOLAM 0.5 MG TABLET] 30 tablet     Sig: TAKE 1/2 TO 1 TABLETS BY MOUTH TWICE A DAY AS NEEDED FOR ANXIETY    Date of patient request: 01/20/2018 Last office visit: 07/11/2017 Date of last refill: 05/18/2017 Last refill amount: 30 Follow up time period per chart:

## 2018-01-21 NOTE — Telephone Encounter (Signed)
Refilled, but please schedule visit prior to next refill needed.  Thanks

## 2018-01-29 DIAGNOSIS — Z96641 Presence of right artificial hip joint: Secondary | ICD-10-CM | POA: Diagnosis not present

## 2018-02-06 DIAGNOSIS — T2010XA Burn of first degree of head, face, and neck, unspecified site, initial encounter: Secondary | ICD-10-CM | POA: Diagnosis not present

## 2018-02-11 DIAGNOSIS — T2010XD Burn of first degree of head, face, and neck, unspecified site, subsequent encounter: Secondary | ICD-10-CM | POA: Diagnosis not present

## 2018-02-14 ENCOUNTER — Telehealth: Payer: Self-pay | Admitting: Family Medicine

## 2018-02-14 DIAGNOSIS — Z113 Encounter for screening for infections with a predominantly sexual mode of transmission: Secondary | ICD-10-CM

## 2018-02-14 DIAGNOSIS — E785 Hyperlipidemia, unspecified: Secondary | ICD-10-CM

## 2018-02-14 DIAGNOSIS — Z130 Encounter for screening for diseases of the blood and blood-forming organs and certain disorders involving the immune mechanism: Secondary | ICD-10-CM

## 2018-02-14 NOTE — Telephone Encounter (Signed)
Please advise 

## 2018-02-14 NOTE — Telephone Encounter (Signed)
Copied from [place] [id number]. Topic: General - Other >> Feb 14, 2018  2:47 PM Percell Belt A wrote: Reason for CRM:  Pt called in, she is having a cpe 1/14.  She would like to know if orders for those lab could be put in before the end of the year?  She she was not able to get in and get a cpe before the end of the year so she would liek to at least get her labs done.  Please advise    Cb# - [phone]

## 2018-02-15 NOTE — Telephone Encounter (Signed)
Future orders entered. I placed same orders as last physical - make sure she is ok with these tests including STI testing.   If not, can cancel ones she does not want.

## 2018-02-17 ENCOUNTER — Telehealth: Payer: Self-pay | Admitting: *Deleted

## 2018-02-17 NOTE — Telephone Encounter (Signed)
Left message on pt's voicemail that lab orders are in. She may come in as early as 8:00am for fasting labs.

## 2018-02-18 ENCOUNTER — Other Ambulatory Visit: Payer: Self-pay | Admitting: Family Medicine

## 2018-02-18 ENCOUNTER — Ambulatory Visit (INDEPENDENT_AMBULATORY_CARE_PROVIDER_SITE_OTHER): Payer: BLUE CROSS/BLUE SHIELD | Admitting: Family Medicine

## 2018-02-18 DIAGNOSIS — Z113 Encounter for screening for infections with a predominantly sexual mode of transmission: Secondary | ICD-10-CM

## 2018-02-18 DIAGNOSIS — Z130 Encounter for screening for diseases of the blood and blood-forming organs and certain disorders involving the immune mechanism: Secondary | ICD-10-CM | POA: Diagnosis not present

## 2018-02-18 DIAGNOSIS — F4323 Adjustment disorder with mixed anxiety and depressed mood: Secondary | ICD-10-CM

## 2018-02-18 DIAGNOSIS — E785 Hyperlipidemia, unspecified: Secondary | ICD-10-CM | POA: Diagnosis not present

## 2018-02-18 NOTE — Telephone Encounter (Signed)
Copied from [place] [id number]. Topic: Quick Communication - Rx Refill/Question >> Feb 18, 2018 12:39 PM Windy Kalata wrote: Medication: ALPRAZolam Duanne Moron) 0.5 MG tablet  Has the patient contacted their pharmacy? No. (Agent: If no, request that the patient contact the pharmacy for the refill.) (Agent: If yes, when and what did the pharmacy advise?)  Preferred Pharmacy (with phone number or street name): [id number] [address], [place] - [place] [phone] (Phone) [phone] (Fax)    Agent: Please be advised that RX refills may take up to 3 business days. We ask that you follow-up with your pharmacy.

## 2018-02-18 NOTE — Progress Notes (Signed)
Nurse visit

## 2018-02-18 NOTE — Telephone Encounter (Signed)
Requested medication (s) are due for refill today:  yes  Requested medication (s) are on the active medication list:  yes  Future visit scheduled:  yes  Last Refill: 01/21/18; #30; no refills  Requested Prescriptions  Pending Prescriptions Disp Refills   ALPRAZolam (XANAX) 0.5 MG tablet 30 tablet 0    Sig: TAKE 1/2 TO 1 TABLETS BY MOUTH TWICE A DAY AS NEEDED FOR ANXIETY     Not Delegated - Psychiatry:  Anxiolytics/Hypnotics Failed - 02/18/2018 12:48 PM      Failed - This refill cannot be delegated      Failed - Urine Drug Screen completed in last 360 days.      Failed - Valid encounter within last 6 months    Recent Outpatient Visits          Today Routine screening for STI (sexually transmitted infection)   [facility], Lilia Argue, MD   7 months ago Rash and nonspecific skin eruption   [facility], Lilia Argue, MD   7 months ago Dysuria   [facility], Ranell Patrick, MD   7 months ago Dysuria   [facility] at [place], PA-C   7 months ago Contusion of face, initial encounter   [facility], Ranell Patrick, MD      Future Appointments            In 2 weeks Carlota Raspberry Ranell Patrick, MD [facility] at [place], [facility]

## 2018-02-19 LAB — CBC
Hematocrit: 37.9 % (ref 34.0–46.6)
Hemoglobin: 12.9 g/dL (ref 11.1–15.9)
MCH: 34.2 pg — AB (ref 26.6–33.0)
MCHC: 34 g/dL (ref 31.5–35.7)
MCV: 101 fL — ABNORMAL HIGH (ref 79–97)
PLATELETS: 228 10*3/uL (ref 150–450)
RBC: 3.77 x10E6/uL (ref 3.77–5.28)
RDW: 11.7 % — AB (ref 12.3–15.4)
WBC: 5.2 10*3/uL (ref 3.4–10.8)

## 2018-02-19 LAB — COMPREHENSIVE METABOLIC PANEL
A/G RATIO: 2.1 (ref 1.2–2.2)
ALT: 60 IU/L — ABNORMAL HIGH (ref 0–32)
AST: 69 IU/L — AB (ref 0–40)
Albumin: 4.6 g/dL (ref 3.6–4.8)
Alkaline Phosphatase: 114 IU/L (ref 39–117)
BUN / CREAT RATIO: 15 (ref 12–28)
BUN: 11 mg/dL (ref 8–27)
Bilirubin Total: 0.5 mg/dL (ref 0.0–1.2)
CALCIUM: 10.2 mg/dL (ref 8.7–10.3)
CO2: 25 mmol/L (ref 20–29)
Chloride: 101 mmol/L (ref 96–106)
Creatinine, Ser: 0.74 mg/dL (ref 0.57–1.00)
GFR, EST AFRICAN AMERICAN: 101 mL/min/{1.73_m2} (ref >59)
GFR, EST NON AFRICAN AMERICAN: 88 mL/min/{1.73_m2} (ref >59)
GLOBULIN, TOTAL: 2.2 g/dL (ref 1.5–4.5)
Glucose: 86 mg/dL (ref 65–99)
POTASSIUM: 3.6 mmol/L (ref 3.5–5.2)
SODIUM: 141 mmol/L (ref 134–144)
TOTAL PROTEIN: 6.8 g/dL (ref 6.0–8.5)

## 2018-02-19 LAB — RPR: RPR Ser Ql: NONREACTIVE

## 2018-02-19 LAB — LIPID PANEL
CHOL/HDL RATIO: 2.6 ratio (ref 0.0–4.4)
Cholesterol, Total: 209 mg/dL — ABNORMAL HIGH (ref 100–199)
HDL: 80 mg/dL (ref >39)
LDL Calculated: 99 mg/dL (ref 0–99)
Triglycerides: 148 mg/dL (ref 0–149)
VLDL Cholesterol Cal: 30 mg/dL (ref 5–40)

## 2018-02-21 LAB — GC/CHLAMYDIA PROBE AMP
CHLAMYDIA, DNA PROBE: NEGATIVE
NEISSERIA GONORRHOEAE BY PCR: NEGATIVE

## 2018-02-21 MED ORDER — ALPRAZOLAM 0.5 MG PO TABS
ORAL_TABLET | ORAL | 5 refills | Status: DC
  Filled 2020-11-04: qty 30, 30d supply, fill #0
  Filled 2020-12-22: qty 30, 30d supply, fill #1
  Filled 2021-02-23: qty 30, 30d supply, fill #2

## 2018-02-21 NOTE — Telephone Encounter (Signed)
Controlled substance database (PDMP) reviewed. No concerns appreciated.  Appears to be last refilled in March.  Has upcoming appointment.  Xanax refilled.

## 2018-02-25 DIAGNOSIS — L720 Epidermal cyst: Secondary | ICD-10-CM | POA: Diagnosis not present

## 2018-02-25 DIAGNOSIS — D2261 Melanocytic nevi of right upper limb, including shoulder: Secondary | ICD-10-CM | POA: Diagnosis not present

## 2018-02-25 DIAGNOSIS — D2262 Melanocytic nevi of left upper limb, including shoulder: Secondary | ICD-10-CM | POA: Diagnosis not present

## 2018-02-25 DIAGNOSIS — Z85828 Personal history of other malignant neoplasm of skin: Secondary | ICD-10-CM | POA: Diagnosis not present

## 2018-03-07 ENCOUNTER — Encounter: Payer: BLUE CROSS/BLUE SHIELD | Admitting: Family Medicine

## 2018-03-07 ENCOUNTER — Encounter

## 2018-03-17 ENCOUNTER — Telehealth: Payer: Self-pay | Admitting: Family Medicine

## 2018-03-17 NOTE — Telephone Encounter (Signed)
Copied from [place] [phone]. Topic: Quick Communication - See Telephone Encounter >> Mar 17, 2018  2:22 PM Bea Graff, NT wrote: CRM for notification. See Telephone encounter for: 03/17/18. Pt requesting a call back with her 12/24 lab results and she states that she cancelled her appt for 03/07/2018 back in December.

## 2018-03-18 NOTE — Telephone Encounter (Signed)
Informed pt of labs.  

## 2018-10-02 ENCOUNTER — Other Ambulatory Visit: Payer: Self-pay | Admitting: Family Medicine

## 2018-10-02 DIAGNOSIS — F4323 Adjustment disorder with mixed anxiety and depressed mood: Secondary | ICD-10-CM

## 2018-10-02 NOTE — Telephone Encounter (Signed)
Please advise 

## 2018-10-30 NOTE — Telephone Encounter (Signed)
LVM to schedule appt for med refills.

## 2018-11-14 DIAGNOSIS — Z8249 Family history of ischemic heart disease and other diseases of the circulatory system: Secondary | ICD-10-CM | POA: Diagnosis not present

## 2018-11-14 DIAGNOSIS — Z13820 Encounter for screening for osteoporosis: Secondary | ICD-10-CM | POA: Diagnosis not present

## 2018-11-14 DIAGNOSIS — Z0000 Encounter for general adult medical examination without abnormal findings: Secondary | ICD-10-CM | POA: Diagnosis not present

## 2018-11-14 DIAGNOSIS — Z1239 Encounter for other screening for malignant neoplasm of breast: Secondary | ICD-10-CM | POA: Diagnosis not present

## 2018-11-14 DIAGNOSIS — Z13220 Encounter for screening for lipoid disorders: Secondary | ICD-10-CM | POA: Diagnosis not present

## 2018-11-14 DIAGNOSIS — I10 Essential (primary) hypertension: Secondary | ICD-10-CM | POA: Diagnosis not present

## 2018-11-14 DIAGNOSIS — F419 Anxiety disorder, unspecified: Secondary | ICD-10-CM | POA: Diagnosis not present

## 2018-11-14 DIAGNOSIS — Z79899 Other long term (current) drug therapy: Secondary | ICD-10-CM | POA: Diagnosis not present

## 2018-11-14 DIAGNOSIS — Z833 Family history of diabetes mellitus: Secondary | ICD-10-CM | POA: Diagnosis not present

## 2018-12-15 DIAGNOSIS — Z13820 Encounter for screening for osteoporosis: Secondary | ICD-10-CM | POA: Diagnosis not present

## 2018-12-15 DIAGNOSIS — M8588 Other specified disorders of bone density and structure, other site: Secondary | ICD-10-CM | POA: Diagnosis not present

## 2018-12-15 DIAGNOSIS — M8589 Other specified disorders of bone density and structure, multiple sites: Secondary | ICD-10-CM | POA: Diagnosis not present

## 2018-12-15 DIAGNOSIS — Z780 Asymptomatic menopausal state: Secondary | ICD-10-CM | POA: Diagnosis not present

## 2018-12-15 DIAGNOSIS — Z1231 Encounter for screening mammogram for malignant neoplasm of breast: Secondary | ICD-10-CM | POA: Diagnosis not present

## 2018-12-15 DIAGNOSIS — Z1239 Encounter for other screening for malignant neoplasm of breast: Secondary | ICD-10-CM | POA: Diagnosis not present

## 2018-12-26 DIAGNOSIS — F411 Generalized anxiety disorder: Secondary | ICD-10-CM | POA: Diagnosis not present

## 2018-12-26 DIAGNOSIS — Z1159 Encounter for screening for other viral diseases: Secondary | ICD-10-CM | POA: Diagnosis not present

## 2018-12-26 DIAGNOSIS — Z114 Encounter for screening for human immunodeficiency virus [HIV]: Secondary | ICD-10-CM | POA: Diagnosis not present

## 2018-12-26 DIAGNOSIS — F4310 Post-traumatic stress disorder, unspecified: Secondary | ICD-10-CM | POA: Diagnosis not present

## 2019-01-05 DIAGNOSIS — F4310 Post-traumatic stress disorder, unspecified: Secondary | ICD-10-CM | POA: Diagnosis not present

## 2019-01-05 DIAGNOSIS — F411 Generalized anxiety disorder: Secondary | ICD-10-CM | POA: Diagnosis not present

## 2019-01-05 DIAGNOSIS — F1020 Alcohol dependence, uncomplicated: Secondary | ICD-10-CM | POA: Diagnosis not present

## 2019-01-05 DIAGNOSIS — R030 Elevated blood-pressure reading, without diagnosis of hypertension: Secondary | ICD-10-CM | POA: Diagnosis not present

## 2019-01-07 DIAGNOSIS — F1020 Alcohol dependence, uncomplicated: Secondary | ICD-10-CM | POA: Diagnosis not present

## 2019-01-07 DIAGNOSIS — F4310 Post-traumatic stress disorder, unspecified: Secondary | ICD-10-CM | POA: Diagnosis not present

## 2019-01-07 DIAGNOSIS — Z7141 Alcohol abuse counseling and surveillance of alcoholic: Secondary | ICD-10-CM | POA: Diagnosis not present

## 2019-01-07 DIAGNOSIS — F411 Generalized anxiety disorder: Secondary | ICD-10-CM | POA: Diagnosis not present

## 2019-01-14 DIAGNOSIS — F411 Generalized anxiety disorder: Secondary | ICD-10-CM | POA: Diagnosis not present

## 2019-01-14 DIAGNOSIS — F4310 Post-traumatic stress disorder, unspecified: Secondary | ICD-10-CM | POA: Diagnosis not present

## 2019-01-14 DIAGNOSIS — F1020 Alcohol dependence, uncomplicated: Secondary | ICD-10-CM | POA: Diagnosis not present

## 2019-01-14 DIAGNOSIS — Z7141 Alcohol abuse counseling and surveillance of alcoholic: Secondary | ICD-10-CM | POA: Diagnosis not present

## 2019-01-21 DIAGNOSIS — F4310 Post-traumatic stress disorder, unspecified: Secondary | ICD-10-CM | POA: Diagnosis not present

## 2019-01-21 DIAGNOSIS — F1020 Alcohol dependence, uncomplicated: Secondary | ICD-10-CM | POA: Diagnosis not present

## 2019-01-21 DIAGNOSIS — F411 Generalized anxiety disorder: Secondary | ICD-10-CM | POA: Diagnosis not present

## 2019-01-29 DIAGNOSIS — Z1283 Encounter for screening for malignant neoplasm of skin: Secondary | ICD-10-CM | POA: Diagnosis not present

## 2019-01-29 DIAGNOSIS — Z85828 Personal history of other malignant neoplasm of skin: Secondary | ICD-10-CM | POA: Diagnosis not present

## 2019-01-29 DIAGNOSIS — D239 Other benign neoplasm of skin, unspecified: Secondary | ICD-10-CM | POA: Diagnosis not present

## 2019-01-29 DIAGNOSIS — L578 Other skin changes due to chronic exposure to nonionizing radiation: Secondary | ICD-10-CM | POA: Diagnosis not present

## 2019-05-20 ENCOUNTER — Ambulatory Visit: Payer: BLUE CROSS/BLUE SHIELD

## 2019-05-25 ENCOUNTER — Ambulatory Visit: Payer: BLUE CROSS/BLUE SHIELD | Attending: Internal Medicine

## 2019-05-25 DIAGNOSIS — Z23 Encounter for immunization: Secondary | ICD-10-CM

## 2019-05-25 NOTE — Progress Notes (Signed)
   Covid-19 Vaccination Clinic  Name:  Dawn Casey    MRN: [id number] DOB: [birth year]  05/25/2019  [name] was observed post Covid-19 immunization for 15 minutes without incident. She was provided with Vaccine Information Sheet and instruction to access the V-Safe system.   [name] was instructed to call 911 with any severe reactions post vaccine: Marland Kitchen Difficulty breathing  . Swelling of face and throat  . A fast heartbeat  . A bad rash all over body  . Dizziness and weakness   Immunizations Administered    Name Date Dose VIS Date Route   Pfizer COVID-19 Vaccine 05/25/2019 11:45 AM 0.3 mL 02/06/2019 Intramuscular   Manufacturer: [place]   Lot: [id number]   [place]: [id number]

## 2019-06-16 ENCOUNTER — Ambulatory Visit: Payer: BLUE CROSS/BLUE SHIELD | Attending: Internal Medicine

## 2019-06-16 DIAGNOSIS — Z23 Encounter for immunization: Secondary | ICD-10-CM

## 2019-06-16 NOTE — Progress Notes (Signed)
   Covid-19 Vaccination Clinic  Name:  Dawn Casey    MRN: [id number] DOB: 1955-09-02  06/16/2019  Dawn Casey was observed post Covid-19 immunization for 15 minutes without incident. She was provided with Vaccine Information Sheet and instruction to access the V-Safe system.   Dawn Casey was instructed to call 911 with any severe reactions post vaccine: Marland Kitchen Difficulty breathing  . Swelling of face and throat  . A fast heartbeat  . A bad rash all over body  . Dizziness and weakness   Immunizations Administered    Name Date Dose VIS Date Route   Pfizer COVID-19 Vaccine 06/16/2019 12:40 PM 0.3 mL 04/22/2018 Intramuscular   Manufacturer: [place]   Lot: [id number]   [place]: [id number]

## 2020-01-04 ENCOUNTER — Encounter: Payer: Self-pay | Admitting: *Deleted

## 2020-01-30 ENCOUNTER — Ambulatory Visit: Payer: BLUE CROSS/BLUE SHIELD | Attending: Internal Medicine

## 2020-01-30 DIAGNOSIS — Z23 Encounter for immunization: Secondary | ICD-10-CM

## 2020-01-30 NOTE — Progress Notes (Signed)
   Covid-19 Vaccination Clinic  Name:  Ralynn San    MRN: [id number] DOB: May 30, 1955  01/30/2020  Ms. Tufte was observed post Covid-19 immunization for 15 minutes without incident. She was provided with Vaccine Information Sheet and instruction to access the V-Safe system.   Ms. Digiulio was instructed to call 911 with any severe reactions post vaccine: Marland Kitchen Difficulty breathing  . Swelling of face and throat  . A fast heartbeat  . A bad rash all over body  . Dizziness and weakness   Immunizations Administered    Name Date Dose VIS Date Route   Pfizer COVID-19 Vaccine 01/30/2020 12:31 PM 0.3 mL 12/16/2019 Intramuscular   Manufacturer: [place]   Lot: [id number]   NDC: [address]-7

## 2020-03-23 DIAGNOSIS — G4700 Insomnia, unspecified: Secondary | ICD-10-CM | POA: Diagnosis not present

## 2020-03-23 DIAGNOSIS — F329 Major depressive disorder, single episode, unspecified: Secondary | ICD-10-CM | POA: Diagnosis not present

## 2020-03-23 DIAGNOSIS — F4310 Post-traumatic stress disorder, unspecified: Secondary | ICD-10-CM | POA: Diagnosis not present

## 2020-03-23 DIAGNOSIS — F411 Generalized anxiety disorder: Secondary | ICD-10-CM | POA: Diagnosis not present

## 2020-08-30 DIAGNOSIS — R0981 Nasal congestion: Secondary | ICD-10-CM | POA: Diagnosis not present

## 2020-08-30 DIAGNOSIS — R067 Sneezing: Secondary | ICD-10-CM | POA: Diagnosis not present

## 2020-10-06 DIAGNOSIS — Z808 Family history of malignant neoplasm of other organs or systems: Secondary | ICD-10-CM | POA: Diagnosis not present

## 2020-10-06 DIAGNOSIS — Z85828 Personal history of other malignant neoplasm of skin: Secondary | ICD-10-CM | POA: Diagnosis not present

## 2020-10-06 DIAGNOSIS — L821 Other seborrheic keratosis: Secondary | ICD-10-CM | POA: Diagnosis not present

## 2020-10-06 DIAGNOSIS — D2372 Other benign neoplasm of skin of left lower limb, including hip: Secondary | ICD-10-CM | POA: Diagnosis not present

## 2020-10-06 DIAGNOSIS — D225 Melanocytic nevi of trunk: Secondary | ICD-10-CM | POA: Diagnosis not present

## 2020-10-06 DIAGNOSIS — L814 Other melanin hyperpigmentation: Secondary | ICD-10-CM | POA: Diagnosis not present

## 2020-10-06 DIAGNOSIS — D2239 Melanocytic nevi of other parts of face: Secondary | ICD-10-CM | POA: Diagnosis not present

## 2020-10-06 DIAGNOSIS — D1801 Hemangioma of skin and subcutaneous tissue: Secondary | ICD-10-CM | POA: Diagnosis not present

## 2020-11-04 ENCOUNTER — Other Ambulatory Visit (HOSPITAL_BASED_OUTPATIENT_CLINIC_OR_DEPARTMENT_OTHER): Payer: Self-pay

## 2020-11-04 MED ORDER — PROPRANOLOL HCL 20 MG PO TABS: ORAL_TABLET | ORAL | 0 refills | Status: DC

## 2020-11-04 MED ORDER — LISINOPRIL 10 MG PO TABS
ORAL_TABLET | ORAL | 1 refills | Status: DC
  Filled 2020-11-04: qty 30, 30d supply, fill #0

## 2020-11-04 MED ORDER — TRETINOIN 0.05 % EX CREA: TOPICAL_CREAM | CUTANEOUS | 9 refills | Status: DC

## 2020-11-08 ENCOUNTER — Other Ambulatory Visit: Payer: Self-pay

## 2020-11-08 ENCOUNTER — Other Ambulatory Visit: Payer: Self-pay | Admitting: Family Medicine

## 2020-11-08 ENCOUNTER — Other Ambulatory Visit (HOSPITAL_BASED_OUTPATIENT_CLINIC_OR_DEPARTMENT_OTHER): Payer: Self-pay

## 2020-11-11 ENCOUNTER — Other Ambulatory Visit (HOSPITAL_BASED_OUTPATIENT_CLINIC_OR_DEPARTMENT_OTHER): Payer: Self-pay

## 2020-11-16 ENCOUNTER — Other Ambulatory Visit (HOSPITAL_BASED_OUTPATIENT_CLINIC_OR_DEPARTMENT_OTHER): Payer: Self-pay

## 2020-11-16 MED ORDER — LISINOPRIL 10 MG PO TABS
ORAL_TABLET | ORAL | 1 refills | Status: DC
  Filled 2020-11-16 – 2020-12-22 (×3): qty 90, 90d supply, fill #0
  Filled 2021-11-08 – 2021-11-13 (×3): qty 90, 90d supply, fill #1

## 2020-11-30 ENCOUNTER — Other Ambulatory Visit (HOSPITAL_BASED_OUTPATIENT_CLINIC_OR_DEPARTMENT_OTHER): Payer: Self-pay

## 2020-12-20 ENCOUNTER — Other Ambulatory Visit (HOSPITAL_BASED_OUTPATIENT_CLINIC_OR_DEPARTMENT_OTHER): Payer: Self-pay

## 2020-12-22 ENCOUNTER — Other Ambulatory Visit (HOSPITAL_BASED_OUTPATIENT_CLINIC_OR_DEPARTMENT_OTHER): Payer: Self-pay

## 2021-02-23 ENCOUNTER — Other Ambulatory Visit: Payer: Self-pay

## 2021-02-23 ENCOUNTER — Other Ambulatory Visit (HOSPITAL_BASED_OUTPATIENT_CLINIC_OR_DEPARTMENT_OTHER): Payer: Self-pay

## 2021-02-24 ENCOUNTER — Other Ambulatory Visit (HOSPITAL_BASED_OUTPATIENT_CLINIC_OR_DEPARTMENT_OTHER): Payer: Self-pay

## 2021-02-24 MED ORDER — LISINOPRIL 10 MG PO TABS
ORAL_TABLET | ORAL | 0 refills | Status: DC
  Filled 2021-02-24: qty 90, 90d supply, fill #0

## 2021-04-13 DIAGNOSIS — D1801 Hemangioma of skin and subcutaneous tissue: Secondary | ICD-10-CM | POA: Diagnosis not present

## 2021-04-13 DIAGNOSIS — L814 Other melanin hyperpigmentation: Secondary | ICD-10-CM | POA: Diagnosis not present

## 2021-04-13 DIAGNOSIS — D2371 Other benign neoplasm of skin of right lower limb, including hip: Secondary | ICD-10-CM | POA: Diagnosis not present

## 2021-04-13 DIAGNOSIS — Z808 Family history of malignant neoplasm of other organs or systems: Secondary | ICD-10-CM | POA: Diagnosis not present

## 2021-04-13 DIAGNOSIS — D2372 Other benign neoplasm of skin of left lower limb, including hip: Secondary | ICD-10-CM | POA: Diagnosis not present

## 2021-04-16 ENCOUNTER — Other Ambulatory Visit (HOSPITAL_BASED_OUTPATIENT_CLINIC_OR_DEPARTMENT_OTHER): Payer: Self-pay

## 2021-04-17 ENCOUNTER — Other Ambulatory Visit (HOSPITAL_BASED_OUTPATIENT_CLINIC_OR_DEPARTMENT_OTHER): Payer: Self-pay

## 2021-04-18 ENCOUNTER — Encounter (HOSPITAL_BASED_OUTPATIENT_CLINIC_OR_DEPARTMENT_OTHER): Payer: Self-pay | Admitting: Pharmacist

## 2021-04-18 ENCOUNTER — Other Ambulatory Visit (HOSPITAL_BASED_OUTPATIENT_CLINIC_OR_DEPARTMENT_OTHER): Payer: Self-pay

## 2021-04-19 ENCOUNTER — Other Ambulatory Visit (HOSPITAL_BASED_OUTPATIENT_CLINIC_OR_DEPARTMENT_OTHER): Payer: Self-pay

## 2021-04-19 DIAGNOSIS — I10 Essential (primary) hypertension: Secondary | ICD-10-CM | POA: Diagnosis not present

## 2021-04-19 DIAGNOSIS — F419 Anxiety disorder, unspecified: Secondary | ICD-10-CM | POA: Diagnosis not present

## 2021-04-19 DIAGNOSIS — Z8249 Family history of ischemic heart disease and other diseases of the circulatory system: Secondary | ICD-10-CM | POA: Diagnosis not present

## 2021-04-19 MED ORDER — LISINOPRIL 10 MG PO TABS
10.0000 mg | ORAL_TABLET | Freq: Every day | ORAL | 3 refills | Status: DC
  Filled 2021-04-19: qty 90, 90d supply, fill #0
  Filled 2021-10-06 – 2021-10-07 (×2): qty 90, 90d supply, fill #1
  Filled 2022-02-02: qty 90, 90d supply, fill #2
  Filled 2022-03-29: qty 90, 90d supply, fill #3

## 2021-04-19 MED ORDER — ALPRAZOLAM 0.5 MG PO TABS
ORAL_TABLET | ORAL | 5 refills | Status: DC
  Filled 2021-04-19: qty 30, 30d supply, fill #0
  Filled 2021-05-21: qty 30, 30d supply, fill #1
  Filled 2021-05-27: qty 30, 30d supply, fill #0
  Filled 2021-06-26: qty 30, 30d supply, fill #1
  Filled 2021-08-02: qty 30, 30d supply, fill #2
  Filled 2021-08-30: qty 30, 30d supply, fill #3
  Filled 2021-10-06: qty 30, 30d supply, fill #4

## 2021-04-20 ENCOUNTER — Other Ambulatory Visit (HOSPITAL_BASED_OUTPATIENT_CLINIC_OR_DEPARTMENT_OTHER): Payer: Self-pay

## 2021-05-22 ENCOUNTER — Other Ambulatory Visit (HOSPITAL_BASED_OUTPATIENT_CLINIC_OR_DEPARTMENT_OTHER): Payer: Self-pay

## 2021-05-27 ENCOUNTER — Other Ambulatory Visit (HOSPITAL_COMMUNITY): Payer: Self-pay

## 2021-05-29 ENCOUNTER — Other Ambulatory Visit (HOSPITAL_BASED_OUTPATIENT_CLINIC_OR_DEPARTMENT_OTHER): Payer: Self-pay

## 2021-06-26 ENCOUNTER — Emergency Department (HOSPITAL_COMMUNITY)
Admission: EM | Admit: 2021-06-26 | Discharge: 2021-06-26 | Disposition: A | Discharge status: Home / Self Care | Payer: 59 | Attending: Emergency Medicine | Admitting: Emergency Medicine

## 2021-06-26 ENCOUNTER — Encounter (HOSPITAL_COMMUNITY): Payer: Self-pay

## 2021-06-26 ENCOUNTER — Emergency Department (HOSPITAL_COMMUNITY): Payer: 59

## 2021-06-26 ENCOUNTER — Other Ambulatory Visit (HOSPITAL_COMMUNITY): Payer: Self-pay

## 2021-06-26 DIAGNOSIS — S8991XA Unspecified injury of right lower leg, initial encounter: Secondary | ICD-10-CM | POA: Diagnosis present

## 2021-06-26 DIAGNOSIS — R519 Headache, unspecified: Secondary | ICD-10-CM | POA: Diagnosis not present

## 2021-06-26 DIAGNOSIS — Z7982 Long term (current) use of aspirin: Secondary | ICD-10-CM | POA: Insufficient documentation

## 2021-06-26 DIAGNOSIS — W19XXXA Unspecified fall, initial encounter: Secondary | ICD-10-CM

## 2021-06-26 DIAGNOSIS — I10 Essential (primary) hypertension: Secondary | ICD-10-CM | POA: Diagnosis not present

## 2021-06-26 DIAGNOSIS — R0782 Intercostal pain: Secondary | ICD-10-CM | POA: Diagnosis not present

## 2021-06-26 DIAGNOSIS — S8001XA Contusion of right knee, initial encounter: Secondary | ICD-10-CM | POA: Diagnosis not present

## 2021-06-26 DIAGNOSIS — S20212A Contusion of left front wall of thorax, initial encounter: Secondary | ICD-10-CM | POA: Diagnosis not present

## 2021-06-26 DIAGNOSIS — M25551 Pain in right hip: Secondary | ICD-10-CM | POA: Diagnosis not present

## 2021-06-26 DIAGNOSIS — W07XXXA Fall from chair, initial encounter: Secondary | ICD-10-CM | POA: Diagnosis not present

## 2021-06-26 DIAGNOSIS — R0781 Pleurodynia: Secondary | ICD-10-CM | POA: Insufficient documentation

## 2021-06-26 DIAGNOSIS — S0990XA Unspecified injury of head, initial encounter: Secondary | ICD-10-CM | POA: Diagnosis not present

## 2021-06-26 MED ORDER — LIDOCAINE 5 % EX PTCH
1.0000 | MEDICATED_PATCH | CUTANEOUS | 0 refills | Status: DC
Start: 2021-06-26 — End: 2021-07-03
  Filled 2021-06-26: qty 30, 30d supply, fill #0

## 2021-06-26 MED ORDER — LIDOCAINE 5 % EX PTCH
1.0000 | MEDICATED_PATCH | CUTANEOUS | Status: DC
  Administered 2021-06-26: 1 via TRANSDERMAL
  Filled 2021-06-26: qty 1

## 2021-06-26 NOTE — ED Triage Notes (Signed)
Pt presents with c/o fall that occurred approx 5 days ago on hard wood floor. Pt reports she fell onto her left side and has been hurting in her left side rib cage since then. Pt reports it has been painful to breathe.  ?

## 2021-06-26 NOTE — Discharge Instructions (Signed)
Your imaging today was all reassuring.  X-ray of your hip was without fracture or dislocation and only notable for your typical postsurgical changes after your surgery.  Your head CT was without signs of bleeding or any other acute or chronic changes.  Your chest x-ray did not show any signs of pneumonia or infection, additionally it did not pick up any fractures of the rib.  Sometimes, rib fractures are difficult to detect on x-ray, given your pain and difficulty breathing, we can treat this as a suspected rib fracture regardless.  I given you a lidocaine patch, and sent you in a prescription.  If your insurance does not cover this, you can pick up the over-the-counter patches which work nearly as well.  You can take Tylenol and Motrin for pain.  Please use your incentive spirometer at least twice a day for 5 to 10 minutes to prevent developing lung infection.  You should feel better in a couple weeks. ?

## 2021-06-26 NOTE — ED Provider Notes (Signed)
?[facility] DEPT ?Provider Note ? ? ?CSN: [id number] ?Arrival date & time: 06/26/21  3790 ? ?  ? ?History ? ?Chief Complaint  ?Patient presents with  ? Fall  ? ? ?Dawn Casey is a 66 y.o. female who presents to the ED for evaluation for lingering injuries after a fall 5 days ago.  Patient states that she had an exuberant 50 pound puppy accidentally knocked her over causing her to fall into a chair and causing bruises and pain all over the left side of her body and head.  She is also having right-sided hip pain since then and is concerned that she has previous hip surgery of that area.  Patient states that her worst pain is of the left lower lateral rib cage underneath her breast, causing difficulty breathing and shortness of breath secondary to pain.  She is also concerned about her head injury as she has previous history of basilar skull fracture.  She and husband deny nausea vomiting and change in mental status.  Patient specifically requesting CT head in addition to evaluation of her rib pain and hip pain.  Patient denies numbness and tingling.  She has no other systemic complaints. ? ? ?Fall ? ? ?  ? ?Home Medications ?Prior to Admission medications   ?Medication Sig Start Date End Date Taking? Authorizing Provider  ?lidocaine (LIDODERM) 5 % Place 1 patch onto the skin daily. Remove & Discard patch within 12 hours or as directed by MD 06/26/21  Yes Tonye Pearson, PA-C  ?ALPRAZolam (XANAX) 0.5 MG tablet Take 1 tablet (0.5 mg total) by mouth nightly as needed for sleep. 04/19/21     ?aspirin 81 MG chewable tablet Chew 1 tablet (81 mg total) by mouth 2 (two) times daily. ?Patient not taking: Reported on 07/19/2017 01/25/17   Rod Can, MD  ?BIOTIN PO Take 3 each daily by mouth. Hair,  skin and nails gummy    [provider]  ?clindamycin (CLEOCIN) 150 MG capsule TAKE 4 CAPSULES BY MOUTH 1 HOUR PRIOR TO APPOINTMENT for Dental work 06/04/17   [provider]   ?Ibuprofen-Famotidine (DUEXIS) 800-26.6 MG TABS Take 1 tablet 3 (three) times daily as needed by mouth (pain).     [provider]  ?IRON PO Take 1 tablet daily by mouth.     [provider]  ?lisinopril (PRINIVIL,ZESTRIL) 10 MG tablet Take 1 tablet (10 mg total) by mouth daily. 07/02/17   Wendie Agreste, MD  ?lisinopril (ZESTRIL) 10 MG tablet Take 1 tablet by mouth every day 12/25/19   Diamantina Providence H, PA-C  ?lisinopril (ZESTRIL) 10 MG tablet Take 1 tablet (10 mg total) by mouth daily. Patient must have office visit before next refill. 11/16/20     ?lisinopril (ZESTRIL) 10 MG tablet Take 1 tablet (10 mg total) by mouth daily. Patient must have office visit before next refill. 02/24/21     ?lisinopril (ZESTRIL) 10 MG tablet Take 1 tablet (10 mg total) by mouth daily. 04/19/21     ?Multiple Vitamins-Minerals (ADULT GUMMY PO) Take 2 tablets daily by mouth.    [provider]  ?propranolol (INDERAL) 20 MG tablet Take 1 tablet by mouth every day 03/30/20     ?tretinoin (RETIN-A) 0.05 % cream Apply to affected area at night as directed 09/19/20     ?triamcinolone cream (KENALOG) 0.1 % Apply 1 application topically 2 (two) times daily. 07/19/17   Daleen Squibb, MD  ?   ? ?Allergies    ?  Sulfa antibiotics, Tramadol, Prozac [fluoxetine hcl], Adhesive [tape], Amoxicillin, and Erythromycin   ? ?Review of Systems   ?Review of Systems ? ?Physical Exam ?Updated Vital Signs ?BP (!) 119/94 (BP Location: Right Arm)   Pulse 92   Temp 99 ?F (37.2 ?C) (Oral)   Resp 18   SpO2 100%  ?Physical Exam ?Vitals and nursing note reviewed.  ?Constitutional:   ?   General: She is not in acute distress. ?   Appearance: She is not ill-appearing.  ?HENT:  ?   Head: Atraumatic. No raccoon eyes, Battle's sign, contusion or laceration.  ?Eyes:  ?   Conjunctiva/sclera: Conjunctivae normal.  ?   Pupils: Pupils are equal, round, and reactive to light.  ?Cardiovascular:  ?   Rate and Rhythm: Normal rate and regular rhythm.   ?   Pulses: Normal pulses.  ?   Heart sounds: No murmur heard. ?Pulmonary:  ?   Effort: Pulmonary effort is normal. No respiratory distress.  ?   Breath sounds: Normal breath sounds.  ?   Comments: Lung clear to ausculation bilaterally. No tachypnea, no accessory muscle use, no acute distress, no increased work of breathing, no decrease in air movement ? ?Chest:  ? ? ?   Comments: Focal tenderness of the left lower lateral chest wall without obvious crepitus, palpable deformities or bruising.  Symmetric and equal chest expansion ?Abdominal:  ?   General: Abdomen is flat. There is no distension.  ?   Palpations: Abdomen is soft.  ?   Tenderness: There is no abdominal tenderness.  ?Musculoskeletal:     ?   General: Normal range of motion.  ?   Cervical back: Normal range of motion.  ?   Comments: Mild bruising of the anterior right knee, mild tenderness at the hip joint without palpable deformity or hip rotation  ?Skin: ?   General: Skin is warm and dry.  ?   Capillary Refill: Capillary refill takes less than 2 seconds.  ?Neurological:  ?   General: No focal deficit present.  ?   Mental Status: She is alert.  ?Psychiatric:     ?   Mood and Affect: Mood normal.  ? ? ?ED Results / Procedures / Treatments   ?Labs ?(all labs ordered are listed, but only abnormal results are displayed) ?Labs Reviewed - No data to display ? ?EKG ?None ? ?Radiology ?DG Ribs Unilateral W/Chest Left ? ?Result Date: 06/26/2021 ?CLINICAL DATA:  Left rib pain after fall 5 days ago. EXAM: LEFT RIBS AND CHEST - 3+ VIEW COMPARISON:  None. FINDINGS: No fracture or other bone lesions are seen involving the ribs. There is no evidence of pneumothorax or pleural effusion. Both lungs are clear. Heart size and mediastinal contours are within normal limits. IMPRESSION: Negative. Electronically Signed   By: Marijo Conception M.D.   On: 06/26/2021 10:25  ? ?CT Head Wo Contrast ? ?Result Date: 06/26/2021 ?CLINICAL DATA:  Head trauma EXAM: CT HEAD WITHOUT CONTRAST  TECHNIQUE: Contiguous axial images were obtained from the base of the skull through the vertex without intravenous contrast. RADIATION DOSE REDUCTION: This exam was performed according to the departmental dose-optimization program which includes automated exposure control, adjustment of the mA and/or kV according to patient size and/or use of iterative reconstruction technique. COMPARISON:  None. FINDINGS: Brain: No acute intracranial hemorrhage, mass effect, or herniation. No extra-axial fluid collections. No evidence of acute territorial infarct. No hydrocephalus. Mild cortical volume loss. Vascular: No hyperdense vessel or unexpected calcification. Skull:  Normal. Negative for fracture or focal lesion. Sinuses/Orbits: No acute finding. Other: None. IMPRESSION: No acute intracranial process identified. Electronically Signed   By: Ofilia Neas M.D.   On: 06/26/2021 10:33  ? ?DG Hip Unilat W or Wo Pelvis 2-3 Views Right ? ?Result Date: 06/26/2021 ?CLINICAL DATA:  Right hip pain after fall 5 days ago. EXAM: DG HIP (WITH OR WITHOUT PELVIS) 2-3V RIGHT COMPARISON:  None. FINDINGS: Status post right total hip arthroplasty. The acetabular and femoral components appear to be well situated. No fracture or dislocation is noted. IMPRESSION: Negative. Electronically Signed   By: Marijo Conception M.D.   On: 06/26/2021 10:27   ? ?Procedures ?Procedures  ? ?Medications Ordered in ED ?Medications  ?lidocaine (LIDODERM) 5 % 1 patch (has no administration in time range)  ? ? ?ED Course/ Medical Decision Making/ A&P ?  ?                        ?Medical Decision Making ?Amount and/or Complexity of Data Reviewed ?Radiology: ordered. ? ?Risk ?Prescription drug management. ? ? ?Initial impression: Patient presents with symptoms as described above. Vitals stable. No tachypnea. No tachycardia. Clear lung exam with no adventitious sounds and good air movement. There is no ecchymosis. Psitive tenderness focally at left lower lateral rib  cage. Mild tenderness to palpation of right hip, ambulatory without difficulty.  ? ?Workup:  ?I personally ordered and interpreted imaging to include:  ? CT head without acute findings ? Right hip xray with typ

## 2021-06-26 NOTE — ED Notes (Signed)
I provided reinforced discharge education based off of discharge instructions. Pt acknowledged and understood my education. Pt had no further questions/concerns for provider/myself.  °

## 2021-06-28 ENCOUNTER — Emergency Department (HOSPITAL_COMMUNITY)
Admission: EM | Admit: 2021-06-28 | Discharge: 2021-06-29 | Disposition: A | Discharge status: Home / Self Care | Payer: 59 | Attending: Emergency Medicine | Admitting: Emergency Medicine

## 2021-06-28 ENCOUNTER — Other Ambulatory Visit: Payer: Self-pay

## 2021-06-28 ENCOUNTER — Encounter (HOSPITAL_COMMUNITY): Payer: Self-pay

## 2021-06-28 ENCOUNTER — Emergency Department (HOSPITAL_COMMUNITY): Payer: 59

## 2021-06-28 DIAGNOSIS — Z79899 Other long term (current) drug therapy: Secondary | ICD-10-CM | POA: Diagnosis not present

## 2021-06-28 DIAGNOSIS — S20212A Contusion of left front wall of thorax, initial encounter: Secondary | ICD-10-CM | POA: Diagnosis not present

## 2021-06-28 DIAGNOSIS — W1830XA Fall on same level, unspecified, initial encounter: Secondary | ICD-10-CM | POA: Diagnosis not present

## 2021-06-28 DIAGNOSIS — S20302A Unspecified superficial injuries of left front wall of thorax, initial encounter: Secondary | ICD-10-CM | POA: Diagnosis present

## 2021-06-28 DIAGNOSIS — R0781 Pleurodynia: Secondary | ICD-10-CM | POA: Diagnosis not present

## 2021-06-28 DIAGNOSIS — Z7982 Long term (current) use of aspirin: Secondary | ICD-10-CM | POA: Diagnosis not present

## 2021-06-28 DIAGNOSIS — S20212D Contusion of left front wall of thorax, subsequent encounter: Secondary | ICD-10-CM

## 2021-06-28 DIAGNOSIS — E876 Hypokalemia: Secondary | ICD-10-CM | POA: Diagnosis not present

## 2021-06-28 DIAGNOSIS — I10 Essential (primary) hypertension: Secondary | ICD-10-CM | POA: Diagnosis not present

## 2021-06-28 LAB — CBC WITH DIFFERENTIAL/PLATELET
Abs Immature Granulocytes: 0.01 10*3/uL (ref 0.00–0.07)
Basophils Absolute: 0.1 10*3/uL (ref 0.0–0.1)
Basophils Relative: 1 %
Eosinophils Absolute: 0.1 10*3/uL (ref 0.0–0.5)
Eosinophils Relative: 3 %
HCT: 39.5 % (ref 36.0–46.0)
Hemoglobin: 13.6 g/dL (ref 12.0–15.0)
Immature Granulocytes: 0 %
Lymphocytes Relative: 40 %
Lymphs Abs: 2.2 10*3/uL (ref 0.7–4.0)
MCH: 35.7 pg — ABNORMAL HIGH (ref 26.0–34.0)
MCHC: 34.4 g/dL (ref 30.0–36.0)
MCV: 103.7 fL — ABNORMAL HIGH (ref 80.0–100.0)
Monocytes Absolute: 0.4 10*3/uL (ref 0.1–1.0)
Monocytes Relative: 7 %
Neutro Abs: 2.7 10*3/uL (ref 1.7–7.7)
Neutrophils Relative %: 49 %
Platelets: 201 10*3/uL (ref 150–400)
RBC: 3.81 MIL/uL — ABNORMAL LOW (ref 3.87–5.11)
RDW: 11.9 % (ref 11.5–15.5)
WBC: 5.5 10*3/uL (ref 4.0–10.5)
nRBC: 0 % (ref 0.0–0.2)

## 2021-06-28 LAB — BASIC METABOLIC PANEL
Anion gap: 9 (ref 5–15)
BUN: 7 mg/dL — ABNORMAL LOW (ref 8–23)
CO2: 26 mmol/L (ref 22–32)
Calcium: 9.3 mg/dL (ref 8.9–10.3)
Chloride: 102 mmol/L (ref 98–111)
Creatinine, Ser: 0.63 mg/dL (ref 0.44–1.00)
GFR, Estimated: >60 mL/min (ref >60)
Glucose, Bld: 97 mg/dL (ref 70–99)
Potassium: 3.4 mmol/L — ABNORMAL LOW (ref 3.5–5.1)
Sodium: 137 mmol/L (ref 135–145)

## 2021-06-28 MED ORDER — IOHEXOL 300 MG/ML  SOLN
75.0000 mL | Freq: Once | INTRAMUSCULAR | Status: AC | PRN
Start: 2021-06-28 — End: 2021-06-28
  Administered 2021-06-28: 75 mL via INTRAVENOUS

## 2021-06-28 MED ORDER — POTASSIUM CHLORIDE CRYS ER 20 MEQ PO TBCR
40.0000 meq | EXTENDED_RELEASE_TABLET | Freq: Once | ORAL | Status: AC
  Administered 2021-06-29: 40 meq via ORAL
  Filled 2021-06-28: qty 2

## 2021-06-28 NOTE — ED Triage Notes (Signed)
Pt states that she fell a week ago and injured her left ribs. Pt reports that it is hard for her to breathe without severe pain.  ?

## 2021-06-28 NOTE — ED Provider Triage Note (Signed)
Emergency Medicine Provider Triage Evaluation Note ? ?Dawn Casey , a 65 y.o. female  was evaluated in triage.  Pt complains of worsening L rib pain that began earlier tonight.  Patient reports that she fell about 1 week ago and landed on her left breast.  She was seen in the ED 2 days ago for same.  She had a CT scan of her head as well as x-ray of her left ribs.  The x-ray of her left ribs did not show any acute findings however given where her pain was that she was symptomatically treated for a rib fracture.  She was discharged home with lidocaine patches.  She states that she has been doing well at home and did not feel like she needed the lidocaine patch until earlier today when her pain worsened around 8 PM.  She does complain of some shortness of breath and pressure like sensation with deep inspiration. NO fevers or chills. ? ?Review of Systems  ?Positive: + L rib pain, SOB ?Negative:  ? ?Physical Exam  ?BP (!) 177/129 (BP Location: Right Arm)   Pulse 79   Temp 98.6 ?F (37 ?C) (Oral)   Resp 18   Ht 5' 4.5" (1.638 m)   Wt 54.9 kg   SpO2 99%   BMI 20.45 kg/m?  ?Gen:   Awake, no distress   ?Resp:  Normal effort  ?MSK:   Moves extremities without difficulty  ?Other:   ? ?Medical Decision Making  ?Medically screening exam initiated at 10:25 PM.  Appropriate orders placed.  [name] was informed that the remainder of the evaluation will be completed by another provider, this initial triage assessment does not replace that evaluation, and the importance of remaining in the ED until their evaluation is complete. ? ? ?  ?Eustaquio Maize, PA-C ?06/28/21 2225 ? ?

## 2021-06-29 DIAGNOSIS — Z7982 Long term (current) use of aspirin: Secondary | ICD-10-CM | POA: Diagnosis not present

## 2021-06-29 DIAGNOSIS — Z79899 Other long term (current) drug therapy: Secondary | ICD-10-CM | POA: Diagnosis not present

## 2021-06-29 DIAGNOSIS — S20212A Contusion of left front wall of thorax, initial encounter: Secondary | ICD-10-CM | POA: Diagnosis not present

## 2021-06-29 DIAGNOSIS — I10 Essential (primary) hypertension: Secondary | ICD-10-CM | POA: Diagnosis not present

## 2021-06-29 DIAGNOSIS — E876 Hypokalemia: Secondary | ICD-10-CM | POA: Diagnosis not present

## 2021-06-29 NOTE — Discharge Instructions (Signed)
Apply ice for 30 minutes at a time, 4-5 times a day. ? ?You may continue to use your lidocaine patch as needed. ? ?Take ibuprofen or naproxen as needed for pain.  To get additional pain relief, add acetaminophen.  When you combine acetaminophen with either ibuprofen or naproxen, you get better pain relief than you get from taking either medication by itself. ? ?Your blood pressure was high today.  That is likely because of the stress of coming to the hospital and the fact that you are in pain.  Please check your blood pressure once a day and keep a record of it.  Take that record with you when you see your primary care provider.  This is something that she should continue doing forever. ?

## 2021-06-29 NOTE — ED Provider Notes (Signed)
?[facility] DEPT ?Provider Note ? ? ?CSN: [id number] ?Arrival date & time: 06/28/21  2209 ? ?  ? ?History ? ?Chief Complaint  ?Patient presents with  ? Rib Injury  ? ? ?Dawn Casey is a 66 y.o. female. ? ?The history is provided by the patient.  ?She has history of hypertension and comes in because of left-sided chest pain.  She had fallen 6 days ago and injured her knees and her left anterior chest.  She was seen in the ED 2 days ago at which time rib x-rays were negative for fracture but she was told that she might have a fracture that was not seen on x-rays and she was given a prescription for a lidocaine patch.  She has also been taking ibuprofen for pain.  She had been staying in bed, but today she felt better and she rode in a car and ate lunch at a restaurant.  This evening, she had increased pain in the left anterior chest and it started to radiate around to her back.  It hurts to take a deep breath.  She put the lidocaine patch on for the first time, but it is not helping. ?  ?Home Medications ?Prior to Admission medications   ?Medication Sig Start Date End Date Taking? Authorizing Provider  ?ALPRAZolam (XANAX) 0.5 MG tablet Take 1 tablet (0.5 mg total) by mouth nightly as needed for sleep. 04/19/21     ?aspirin 81 MG chewable tablet Chew 1 tablet (81 mg total) by mouth 2 (two) times daily. ?Patient not taking: Reported on 07/19/2017 01/25/17   Rod Can, MD  ?BIOTIN PO Take 3 each daily by mouth. Hair,  skin and nails gummy    [provider]  ?clindamycin (CLEOCIN) 150 MG capsule TAKE 4 CAPSULES BY MOUTH 1 HOUR PRIOR TO APPOINTMENT for Dental work 06/04/17   [provider]  ?Ibuprofen-Famotidine (DUEXIS) 800-26.6 MG TABS Take 1 tablet 3 (three) times daily as needed by mouth (pain).     [provider]  ?IRON PO Take 1 tablet daily by mouth.     [provider]  ?lidocaine (LIDODERM) 5 % Place 1 patch onto the skin daily. Remove &  Discard patch within 12 hours or as directed by MD 06/26/21   Tonye Pearson, PA-C  ?lisinopril (PRINIVIL,ZESTRIL) 10 MG tablet Take 1 tablet (10 mg total) by mouth daily. 07/02/17   Wendie Agreste, MD  ?lisinopril (ZESTRIL) 10 MG tablet Take 1 tablet by mouth every day 12/25/19   Diamantina Providence H, PA-C  ?lisinopril (ZESTRIL) 10 MG tablet Take 1 tablet (10 mg total) by mouth daily. Patient must have office visit before next refill. 11/16/20     ?lisinopril (ZESTRIL) 10 MG tablet Take 1 tablet (10 mg total) by mouth daily. Patient must have office visit before next refill. 02/24/21     ?lisinopril (ZESTRIL) 10 MG tablet Take 1 tablet (10 mg total) by mouth daily. 04/19/21     ?Multiple Vitamins-Minerals (ADULT GUMMY PO) Take 2 tablets daily by mouth.    [provider]  ?propranolol (INDERAL) 20 MG tablet Take 1 tablet by mouth every day 03/30/20     ?tretinoin (RETIN-A) 0.05 % cream Apply to affected area at night as directed 09/19/20     ?triamcinolone cream (KENALOG) 0.1 % Apply 1 application topically 2 (two) times daily. 07/19/17   Daleen Squibb, MD  ?   ? ?Allergies    ?Sulfa antibiotics, Tramadol, Prozac [  fluoxetine hcl], Adhesive [tape], Amoxicillin, and Erythromycin   ? ?Review of Systems   ?Review of Systems  ?All other systems reviewed and are negative. ? ?Physical Exam ?Updated Vital Signs ?BP (!) 161/112   Pulse 73   Temp 98.4 ?F (36.9 ?C) (Oral)   Resp 11   Ht 5' 4.5" (1.638 m)   Wt 54.9 kg   SpO2 100%   BMI 20.45 kg/m?  ?Physical Exam ?Vitals and nursing note reviewed.  ?66 year old female, resting comfortably and in no acute distress. Vital signs are significant for elevated blood pressure. Oxygen saturation is 100%, which is normal. ?Head is normocephalic and atraumatic. PERRLA, EOMI. Oropharynx is clear. ?Neck is nontender and supple without adenopathy or JVD. ?Back is nontender and there is no CVA tenderness. ?Lungs are clear without rales, wheezes, or rhonchi. ?Chest is very  tender over the left anterior chest wall without any visible bruising.  There is no crepitus. ?Heart has regular rate and rhythm without murmur. ?Abdomen is soft, flat, nontender. ?Extremities have no cyanosis or edema, full range of motion is present. ?Skin is warm and dry without rash. ?Neurologic: Mental status is normal, cranial nerves are intact, moves all extremities equally. ? ?ED Results / Procedures / Treatments   ?Labs ?(all labs ordered are listed, but only abnormal results are displayed) ?Labs Reviewed  ?BASIC METABOLIC PANEL - Abnormal; Notable for the following components:  ?    Result Value  ? Potassium 3.4 (*)   ? BUN 7 (*)   ? All other components within normal limits  ?CBC WITH DIFFERENTIAL/PLATELET - Abnormal; Notable for the following components:  ? RBC 3.81 (*)   ? MCV 103.7 (*)   ? MCH 35.7 (*)   ? All other components within normal limits  ? ? ?EKG ?EKG Interpretation ? ?Date/Time:  Wednesday Jun 28 2021 23:27:10 EDT ?Ventricular Rate:  73 ?PR Interval:  165 ?QRS Duration: 98 ?QT Interval:  430 ?QTC Calculation: 474 ?R Axis:   84 ?Text Interpretation: Sinus rhythm Supraventricular bigeminy Borderline right axis deviation Borderline low voltage, extremity leads Nonspecific T abnormalities, anterior leads When compared with ECG of 11/27//2018, Premature atrial complexes are now present Confirmed by Delora Fuel ([id number]) on 06/28/2021 11:36:18 PM ? ?Radiology ?CT Chest W Contrast ? ?Result Date: 06/28/2021 ?CLINICAL DATA:  Fall with left-sided rib pain EXAM: CT CHEST WITH CONTRAST TECHNIQUE: Multidetector CT imaging of the chest was performed during intravenous contrast administration. RADIATION DOSE REDUCTION: This exam was performed according to the departmental dose-optimization program which includes automated exposure control, adjustment of the mA and/or kV according to patient size and/or use of iterative reconstruction technique. CONTRAST:  62m OMNIPAQUE IOHEXOL 300 MG/ML  SOLN COMPARISON:   Radiograph 06/26/2021 FINDINGS: Cardiovascular: Nonaneurysmal aorta. No dissection. Normal aortic contour. Normal cardiac size. No pericardial effusion Mediastinum/Nodes: No enlarged mediastinal, hilar, or axillary lymph nodes. Thyroid gland, trachea, and esophagus demonstrate no significant findings. Lungs/Pleura: Lungs are clear. No pleural effusion or pneumothorax. Upper Abdomen: Hepatic steatosis. 12 mm hypodense lesion in the right hepatic lobe possibly a small cyst or hemangioma. Musculoskeletal: No chest wall abnormality. No acute or significant osseous findings. IMPRESSION: 1. No CT evidence for acute intrathoracic abnormality. 2. Hepatic steatosis. 12 mm hypodense lesion in the right hepatic lobe possibly a small cyst or hemangioma, consider correlation attempt with nonemergent abdominal ultrasound. Aortic Atherosclerosis (ICD10-I70.0). Electronically Signed   By: KDonavan FoilM.D.   On: 06/28/2021 23:29   ? ?Procedures ?Procedures  ? ? ?  Medications Ordered in ED ?Medications  ?iohexol (OMNIPAQUE) 300 MG/ML solution 75 mL (75 mLs Intravenous Contrast Given 06/28/21 2314)  ?potassium chloride SA (KLOR-CON M) CR tablet 40 mEq (40 mEq Oral Given 06/29/21 0004)  ? ? ?ED Course/ Medical Decision Making/ A&P ?  ?                        ?Medical Decision Making ?Risk ?Prescription drug management. ? ? ?Left-sided chest pain secondary to trauma from 1 week ago.  Old records reviewed showing ED visit 2 days ago at which time rib x-rays were read as negative.  She was sent for CT of the chest today which showed no acute injury, no evidence of fracture.  I have independently viewed the images, and agree with the radiologist's interpretation.  She was also concerned about possibility of blood clots in her lungs since her sister had been diagnosed with blood clots, scan was done with contrast and no pulmonary embolism was seen.  Patient is advised of the expected timeframe to have chest wall contusion he will.  Blood  pressure is elevated, most likely secondary to pain as well as anxiety about coming into the hospital.  It is recommended that she monitor her blood pressure at home.  Labs were obtained and were significant only for mil

## 2021-07-03 ENCOUNTER — Other Ambulatory Visit (HOSPITAL_COMMUNITY): Payer: Self-pay

## 2021-08-03 ENCOUNTER — Other Ambulatory Visit (HOSPITAL_COMMUNITY): Payer: Self-pay

## 2021-08-31 ENCOUNTER — Other Ambulatory Visit (HOSPITAL_COMMUNITY): Payer: Self-pay

## 2021-08-31 DIAGNOSIS — Z13220 Encounter for screening for lipoid disorders: Secondary | ICD-10-CM | POA: Diagnosis not present

## 2021-08-31 DIAGNOSIS — I10 Essential (primary) hypertension: Secondary | ICD-10-CM | POA: Diagnosis not present

## 2021-08-31 DIAGNOSIS — R7301 Impaired fasting glucose: Secondary | ICD-10-CM | POA: Diagnosis not present

## 2021-08-31 DIAGNOSIS — Z8781 Personal history of (healed) traumatic fracture: Secondary | ICD-10-CM | POA: Diagnosis not present

## 2021-08-31 DIAGNOSIS — Z1231 Encounter for screening mammogram for malignant neoplasm of breast: Secondary | ICD-10-CM | POA: Diagnosis not present

## 2021-08-31 DIAGNOSIS — Z0000 Encounter for general adult medical examination without abnormal findings: Secondary | ICD-10-CM | POA: Diagnosis not present

## 2021-08-31 DIAGNOSIS — Z8249 Family history of ischemic heart disease and other diseases of the circulatory system: Secondary | ICD-10-CM | POA: Diagnosis not present

## 2021-08-31 DIAGNOSIS — Z87891 Personal history of nicotine dependence: Secondary | ICD-10-CM | POA: Diagnosis not present

## 2021-08-31 DIAGNOSIS — Z1389 Encounter for screening for other disorder: Secondary | ICD-10-CM | POA: Diagnosis not present

## 2021-08-31 DIAGNOSIS — F419 Anxiety disorder, unspecified: Secondary | ICD-10-CM | POA: Diagnosis not present

## 2021-09-02 ENCOUNTER — Other Ambulatory Visit (HOSPITAL_COMMUNITY): Payer: Self-pay

## 2021-10-06 ENCOUNTER — Other Ambulatory Visit (HOSPITAL_BASED_OUTPATIENT_CLINIC_OR_DEPARTMENT_OTHER): Payer: Self-pay

## 2021-10-06 ENCOUNTER — Other Ambulatory Visit (HOSPITAL_COMMUNITY): Payer: Self-pay

## 2021-10-07 ENCOUNTER — Other Ambulatory Visit (HOSPITAL_COMMUNITY): Payer: Self-pay

## 2021-10-09 ENCOUNTER — Other Ambulatory Visit (HOSPITAL_BASED_OUTPATIENT_CLINIC_OR_DEPARTMENT_OTHER): Payer: Self-pay

## 2021-10-12 ENCOUNTER — Other Ambulatory Visit (HOSPITAL_COMMUNITY): Payer: Self-pay

## 2021-10-12 DIAGNOSIS — Z85828 Personal history of other malignant neoplasm of skin: Secondary | ICD-10-CM | POA: Diagnosis not present

## 2021-10-12 DIAGNOSIS — L821 Other seborrheic keratosis: Secondary | ICD-10-CM | POA: Diagnosis not present

## 2021-10-12 DIAGNOSIS — D225 Melanocytic nevi of trunk: Secondary | ICD-10-CM | POA: Diagnosis not present

## 2021-10-12 DIAGNOSIS — D2372 Other benign neoplasm of skin of left lower limb, including hip: Secondary | ICD-10-CM | POA: Diagnosis not present

## 2021-10-12 DIAGNOSIS — D2371 Other benign neoplasm of skin of right lower limb, including hip: Secondary | ICD-10-CM | POA: Diagnosis not present

## 2021-10-12 DIAGNOSIS — L719 Rosacea, unspecified: Secondary | ICD-10-CM | POA: Diagnosis not present

## 2021-10-12 MED ORDER — AZELAIC ACID 15 % EX GEL
CUTANEOUS | 1 refills | Status: AC
  Filled 2021-10-12: qty 50, 30d supply, fill #0

## 2021-10-13 DIAGNOSIS — D7589 Other specified diseases of blood and blood-forming organs: Secondary | ICD-10-CM | POA: Diagnosis not present

## 2021-10-13 DIAGNOSIS — R748 Abnormal levels of other serum enzymes: Secondary | ICD-10-CM | POA: Diagnosis not present

## 2021-10-13 DIAGNOSIS — R7401 Elevation of levels of liver transaminase levels: Secondary | ICD-10-CM | POA: Diagnosis not present

## 2021-10-13 DIAGNOSIS — K769 Liver disease, unspecified: Secondary | ICD-10-CM | POA: Diagnosis not present

## 2021-11-08 ENCOUNTER — Other Ambulatory Visit (HOSPITAL_COMMUNITY): Payer: Self-pay

## 2021-11-08 MED ORDER — ALPRAZOLAM 0.5 MG PO TABS
ORAL_TABLET | ORAL | 5 refills | Status: DC
  Filled 2021-11-08: qty 30, 30d supply, fill #0
  Filled 2022-02-02: qty 30, 30d supply, fill #1
  Filled 2022-03-29: qty 30, 30d supply, fill #2
  Filled 2022-05-06: qty 30, 30d supply, fill #3

## 2021-11-11 ENCOUNTER — Other Ambulatory Visit (HOSPITAL_COMMUNITY): Payer: Self-pay

## 2021-11-13 ENCOUNTER — Other Ambulatory Visit (HOSPITAL_COMMUNITY): Payer: Self-pay

## 2021-11-18 ENCOUNTER — Other Ambulatory Visit (HOSPITAL_COMMUNITY): Payer: Self-pay

## 2021-11-18 ENCOUNTER — Telehealth: Payer: 59 | Admitting: Nurse Practitioner

## 2021-11-18 DIAGNOSIS — R399 Unspecified symptoms and signs involving the genitourinary system: Secondary | ICD-10-CM | POA: Diagnosis not present

## 2021-11-18 MED ORDER — NITROFURANTOIN MONOHYD MACRO 100 MG PO CAPS
100.0000 mg | ORAL_CAPSULE | Freq: Two times a day (BID) | ORAL | 0 refills | Status: DC
Start: 2021-11-18 — End: 2022-06-01
  Filled 2021-11-18: qty 10, 5d supply, fill #0

## 2021-11-18 NOTE — Progress Notes (Signed)

## 2022-02-03 ENCOUNTER — Other Ambulatory Visit (HOSPITAL_COMMUNITY): Payer: Self-pay

## 2022-02-05 ENCOUNTER — Other Ambulatory Visit (HOSPITAL_COMMUNITY): Payer: Self-pay

## 2022-02-05 ENCOUNTER — Other Ambulatory Visit: Payer: Self-pay

## 2022-03-29 ENCOUNTER — Other Ambulatory Visit: Payer: Self-pay

## 2022-03-29 ENCOUNTER — Other Ambulatory Visit (HOSPITAL_COMMUNITY): Payer: Self-pay

## 2022-03-30 ENCOUNTER — Other Ambulatory Visit: Payer: Self-pay

## 2022-05-09 ENCOUNTER — Other Ambulatory Visit (HOSPITAL_COMMUNITY): Payer: Self-pay

## 2022-05-11 ENCOUNTER — Other Ambulatory Visit (HOSPITAL_COMMUNITY): Payer: Self-pay

## 2022-05-11 ENCOUNTER — Encounter: Payer: Self-pay | Admitting: Family Medicine

## 2022-05-11 ENCOUNTER — Telehealth: Payer: Medicare Other | Admitting: Family Medicine

## 2022-05-11 DIAGNOSIS — W19XXXA Unspecified fall, initial encounter: Secondary | ICD-10-CM | POA: Diagnosis not present

## 2022-05-11 DIAGNOSIS — S0083XA Contusion of other part of head, initial encounter: Secondary | ICD-10-CM | POA: Diagnosis not present

## 2022-05-11 MED ORDER — ALPRAZOLAM 0.5 MG PO TABS
ORAL_TABLET | ORAL | 5 refills | Status: DC
  Filled 2022-05-11: qty 30, 30d supply, fill #0
  Filled 2022-06-09: qty 30, 30d supply, fill #1
  Filled 2022-07-16: qty 30, 30d supply, fill #2
  Filled 2022-09-06: qty 30, 30d supply, fill #3
  Filled 2022-10-21: qty 30, 30d supply, fill #4

## 2022-05-11 NOTE — Patient Instructions (Signed)
Dawn Casey, thank you for joining Perlie Mayo, NP for today's virtual visit.  While this provider is not your primary care provider (PCP), if your PCP is located in our provider database this encounter information will be shared with them immediately following your visit.   Dawn Casey account gives you access to today's visit and all your visits, tests, and labs performed at [facility] " click here if you don't have a [facility] account or go to [email or web address]  Consent: (Patient) Dawn Casey provided verbal consent for this virtual visit at the beginning of the encounter.  Current Medications:  Current Outpatient Medications:    ALPRAZolam (XANAX) 0.5 MG tablet, Take 1 tablet (0.5 mg total) by mouth nightly as needed for sleep. NEEDS OV FOR FURTHER REFILLS., Disp: 30 tablet, Rfl: 5   aspirin 81 MG chewable tablet, Chew 1 tablet (81 mg total) by mouth 2 (two) times daily. (Patient not taking: Reported on 07/19/2017), Disp: 60 tablet, Rfl: 1   Azelaic Acid 15 % gel, Apply a thin layer on face in the morning and at bedtime as directed., Disp: 50 g, Rfl: 1   BIOTIN PO, Take 3 each daily by mouth. Hair,  skin and nails gummy, Disp: , Rfl:    clindamycin (CLEOCIN) 150 MG capsule, TAKE 4 CAPSULES BY MOUTH 1 HOUR PRIOR TO APPOINTMENT for Dental work, Disp: , Rfl: 0   Ibuprofen-Famotidine (DUEXIS) 800-26.6 MG TABS, Take 1 tablet 3 (three) times daily as needed by mouth (pain). , Disp: , Rfl:    IRON PO, Take 1 tablet daily by mouth. , Disp: , Rfl:    lisinopril (PRINIVIL,ZESTRIL) 10 MG tablet, Take 1 tablet (10 mg total) by mouth daily., Disp: 30 tablet, Rfl: 1   lisinopril (ZESTRIL) 10 MG tablet, Take 1 tablet by mouth every day, Disp: 30 tablet, Rfl: 1   lisinopril (ZESTRIL) 10 MG tablet, Take 1 tablet (10 mg total) by mouth daily. Patient must have office visit before next refill., Disp: 90 tablet, Rfl: 1   lisinopril (ZESTRIL) 10 MG  tablet, Take 1 tablet (10 mg total) by mouth daily. Patient must have office visit before next refill., Disp: 90 tablet, Rfl: 0   lisinopril (ZESTRIL) 10 MG tablet, Take 1 tablet (10 mg total) by mouth daily., Disp: 90 tablet, Rfl: 3   Multiple Vitamins-Minerals (ADULT GUMMY PO), Take 2 tablets daily by mouth., Disp: , Rfl:    nitrofurantoin, macrocrystal-monohydrate, (MACROBID) 100 MG capsule, Take 1 capsule (100 mg total) by mouth 2 (two) times daily, Disp: 10 capsule, Rfl: 0   propranolol (INDERAL) 20 MG tablet, Take 1 tablet by mouth every day, Disp: 90 tablet, Rfl: 0   tretinoin (RETIN-A) 0.05 % cream, Apply to affected area at night as directed, Disp: 20 g, Rfl: 9   triamcinolone cream (KENALOG) 0.1 %, Apply 1 application topically 2 (two) times daily., Disp: 30 g, Rfl: 0   Medications ordered in this encounter:  No orders of the defined types were placed in this encounter.    *If you need refills on other medications prior to your next appointment, please contact your pharmacy*  Follow-Up: Call back or seek an in-person evaluation if the symptoms worsen or if the condition fails to improve as anticipated.  [facility] [phone]  Other Instructions  Please follow up in person for evaluation of your injuries    If you have been instructed to have an in-person evaluation today at  a local Urgent Care facility, please use the link below. It will take you to a list of all of our available [facility], including address, phone number and hours of operation. Please do not delay care.  [facility]  If you or a family member do not have a primary care provider, use the link below to schedule a visit and establish care. When you choose a [facility] primary care physician or advanced practice provider, you gain a long-term partner in health. Find a Primary Care Provider  Learn more about [facility]'s in-office and virtual care options: [facility]

## 2022-05-11 NOTE — Progress Notes (Signed)
Virtual Visit Consent   Maille Scheuring, you are scheduled for a virtual visit with a [facility] provider today. Just as with appointments in the office, your consent must be obtained to participate. Your consent will be active for this visit and any virtual visit you may have with one of our providers in the next 365 days. If you have a MyChart account, a copy of this consent can be sent to you electronically.  As this is a virtual visit, video technology does not allow for your provider to perform a traditional examination. This may limit your provider's ability to fully assess your condition. If your provider identifies any concerns that need to be evaluated in person or the need to arrange testing (such as labs, EKG, etc.), we will make arrangements to do so. Although advances in technology are sophisticated, we cannot ensure that it will always work on either your end or our end. If the connection with a video visit is poor, the visit may have to be switched to a telephone visit. With either a video or telephone visit, we are not always able to ensure that we have a secure connection.  By engaging in this virtual visit, you consent to the provision of healthcare and authorize for your insurance to be billed (if applicable) for the services provided during this visit. Depending on your insurance coverage, you may receive a charge related to this service.  I need to obtain your verbal consent now. Are you willing to proceed with your visit today? Dawn Casey has provided verbal consent on 05/11/2022 for a virtual visit (video or telephone). Perlie Mayo, NP  Date: 05/11/2022 10:49 AM  Virtual Visit via Video Note   I, Perlie Mayo, connected with  Dawn Casey  ([id number], 66-Nov-1957) on 05/11/22 at 10:45 AM EDT by a video-enabled telemedicine application and verified that I am speaking with the correct person using two identifiers.  Location: Patient: Virtual Visit Location  Patient: Home Provider: Virtual Visit Location Provider: Home Office   I discussed the limitations of evaluation and management by telemedicine and the availability of in person appointments. The patient expressed understanding and agreed to proceed.    History of Present Illness: Dawn Casey is a 67 y.o. who identifies as a female who was assigned female at birth, and is being seen today for fall.  Had a fall 4 days. 05/07/2022 in the morning hours- 3-4am. Hit face after tripping over a dog and cat.  Has bruising on the left side of face under the eye and down jaw line. Reports a history of several falls in past. Given this was a head injury with visual trauma, I have advised in person eval.  Denies neuro deficits as far as a virtual assessment can be performed. Reports she would like documentation in her chart.   Problems:  Patient Active Problem List   Diagnosis Date Noted   History of alcohol abuse 03/20/2016   Other depressive episodes 03/20/2016   Degenerative joint disease 03/20/2016   Osteoarthritis of right hip 01/24/2016   Generalized anxiety disorder 01/16/2014   Anemia 01/16/2014   Essential hypertension 01/16/2014    Allergies:  Allergies  Allergen Reactions   Sulfa Antibiotics Rash   Tramadol     Causes vomiting and bleeding esophageal   Prozac [Fluoxetine Hcl] Hives   Adhesive [Tape] Rash   Amoxicillin Rash    Has patient had a PCN reaction causing immediate rash, facial/tongue/throat swelling, SOB or  lightheadedness with hypotension: Unknown Has patient had a PCN reaction causing severe rash involving mucus membranes or skin necrosis: Unknown Has patient had a PCN reaction that required hospitalization: Unknown Has patient had a PCN reaction occurring within the last 10 years: No If all of the above answers are "NO", then may proceed with Cephalosporin use.    Erythromycin Rash   Medications:  Current Outpatient Medications:    ALPRAZolam (XANAX)  0.5 MG tablet, Take 1 tablet (0.5 mg total) by mouth nightly as needed for sleep. NEEDS OV FOR FURTHER REFILLS., Disp: 30 tablet, Rfl: 5   aspirin 81 MG chewable tablet, Chew 1 tablet (81 mg total) by mouth 2 (two) times daily. (Patient not taking: Reported on 07/19/2017), Disp: 60 tablet, Rfl: 1   Azelaic Acid 15 % gel, Apply a thin layer on face in the morning and at bedtime as directed., Disp: 50 g, Rfl: 1   BIOTIN PO, Take 3 each daily by mouth. Hair,  skin and nails gummy, Disp: , Rfl:    clindamycin (CLEOCIN) 150 MG capsule, TAKE 4 CAPSULES BY MOUTH 1 HOUR PRIOR TO APPOINTMENT for Dental work, Disp: , Rfl: 0   Ibuprofen-Famotidine (DUEXIS) 800-26.6 MG TABS, Take 1 tablet 3 (three) times daily as needed by mouth (pain). , Disp: , Rfl:    IRON PO, Take 1 tablet daily by mouth. , Disp: , Rfl:    lisinopril (PRINIVIL,ZESTRIL) 10 MG tablet, Take 1 tablet (10 mg total) by mouth daily., Disp: 30 tablet, Rfl: 1   lisinopril (ZESTRIL) 10 MG tablet, Take 1 tablet by mouth every day, Disp: 30 tablet, Rfl: 1   lisinopril (ZESTRIL) 10 MG tablet, Take 1 tablet (10 mg total) by mouth daily. Patient must have office visit before next refill., Disp: 90 tablet, Rfl: 1   lisinopril (ZESTRIL) 10 MG tablet, Take 1 tablet (10 mg total) by mouth daily. Patient must have office visit before next refill., Disp: 90 tablet, Rfl: 0   lisinopril (ZESTRIL) 10 MG tablet, Take 1 tablet (10 mg total) by mouth daily., Disp: 90 tablet, Rfl: 3   Multiple Vitamins-Minerals (ADULT GUMMY PO), Take 2 tablets daily by mouth., Disp: , Rfl:    nitrofurantoin, macrocrystal-monohydrate, (MACROBID) 100 MG capsule, Take 1 capsule (100 mg total) by mouth 2 (two) times daily, Disp: 10 capsule, Rfl: 0   propranolol (INDERAL) 20 MG tablet, Take 1 tablet by mouth every day, Disp: 90 tablet, Rfl: 0   tretinoin (RETIN-A) 0.05 % cream, Apply to affected area at night as directed, Disp: 20 g, Rfl: 9   triamcinolone cream (KENALOG) 0.1 %, Apply 1  application topically 2 (two) times daily., Disp: 30 g, Rfl: 0  Observations/Objective: Patient is well-developed, well-nourished in no acute distress.  Resting comfortably  at home.  Head is normocephalic, atraumatic.  No labored breathing.  Speech is clear and coherent with logical content.  Patient is alert and oriented at baseline.  Marked bruising and raccoon eye of left side, with bruising around jaw line  Assessment and Plan:  1. Fall, initial encounter  -signs of a fall present, given history of falls, head and facial hitting and trauma she is encouraged to be seen in person.   Advised I can not provide full assessment of this and that she needs to go be seen in person.  She reports not being able to afford an in person visit- and is still paying off the visit from the last fall she had where she fracture a  rib.  She reports she would just like this documented at least in the chart.  Again, provider advised in person evaluation given the fall and trauma.  Patient acknowledged understanding of the plan, but still refuses in person.   Past Medical, Surgical, Social History, Allergies, and Medications have been Reviewed.     Follow Up Instructions: I discussed the assessment and treatment plan with the patient. The patient was provided an opportunity to ask questions and all were answered. The patient agreed with the plan and demonstrated an understanding of the instructions.  A copy of instructions were sent to the patient via MyChart unless otherwise noted below.     The patient was advised to call back or seek an in-person evaluation if the symptoms worsen or if the condition fails to improve as anticipated.  Time:  I spent 15 minutes with the patient via telehealth technology discussing the above problems/concerns.    Perlie Mayo, NP

## 2022-05-16 ENCOUNTER — Encounter: Payer: Self-pay | Admitting: Family Medicine

## 2022-06-01 ENCOUNTER — Ambulatory Visit (INDEPENDENT_AMBULATORY_CARE_PROVIDER_SITE_OTHER): Payer: Medicare Other | Admitting: Family Medicine

## 2022-06-01 ENCOUNTER — Encounter: Payer: Self-pay | Admitting: Family Medicine

## 2022-06-01 VITALS — BP 132/84 | HR 102 | Temp 98.1°F | Ht 65.0 in | Wt 128.5 lb

## 2022-06-01 DIAGNOSIS — Z23 Encounter for immunization: Secondary | ICD-10-CM

## 2022-06-01 DIAGNOSIS — Z0000 Encounter for general adult medical examination without abnormal findings: Secondary | ICD-10-CM | POA: Diagnosis not present

## 2022-06-01 DIAGNOSIS — M8580 Other specified disorders of bone density and structure, unspecified site: Secondary | ICD-10-CM

## 2022-06-01 DIAGNOSIS — Z780 Asymptomatic menopausal state: Secondary | ICD-10-CM | POA: Diagnosis not present

## 2022-06-01 NOTE — Patient Instructions (Addendum)
Welcome to [facility]! It was a pleasure meeting you today.  As discussed, Please schedule a 1 month follow up visit today.  Get shingrix at pharmacy Check on Tetanus, Diphtheria, and Pertussis (Tdap) at pharmacy  Get advanced care directives done Schedule mammogram and bone density  PLEASE NOTE:  If you had any LAB tests please let us know if you have not heard back within a few days. You may see your results on MyChart before we have a chance to review them but we will give you a call once they are reviewed by [place]. If we ordered any REFERRALS today, please let us know if you have not heard from their office within the next week.  Let us know through MyChart if you are needing REFILLS, or have your pharmacy send [place] the request. You can also use MyChart to communicate with me or any office staff.  Please try these tips to maintain a healthy lifestyle:  Eat most of your calories during the day when you are active. Eliminate processed foods including packaged sweets (pies, cakes, cookies), reduce intake of potatoes, white bread, white pasta, and white rice. Look for whole grain options, oat flour or almond flour.  Each meal should contain half fruits/vegetables, one quarter protein, and one quarter carbs (no bigger than a computer mouse).  Cut down on sweet beverages. This includes juice, soda, and sweet tea. Also watch fruit intake, though this is a healthier sweet option, it still contains natural sugar! Limit to 3 servings daily.  Drink at least 1 glass of water with each meal and aim for at least 8 glasses per day  Exercise at least 150 minutes every week.

## 2022-06-01 NOTE — Progress Notes (Signed)
Phone: [phone]   Subjective:  Patient presents today for their Welcome to Medicare Exam  -new patient.    Preventive Screening-Counseling & Management Fracture rib Jun 26 2021.  From dog Larey Seat recently on left face and bruised-from dog Cyst on liver-resolved History of anemia   Pap 2 years ago. Mamm last July.  Dxa-osteopenia  Vision screen: per patient, done at ophth No results found.  Did SOCIAL [employer]MIN[employer] OF HEALTH screening w/patient but didn't fill out on computer until finished note  Advanced directives: given  Modifiable Risk Factors/behavioral risk assessment/psychosocial risk assessment Regular exercise: occ Diet: good  Wt Readings from Last 3 Encounters:  06/01/22 128 lb 8 oz (58.3 kg)  06/28/21 121 lb (54.9 kg)  07/19/17 121 lb 12.8 oz (55.2 kg)   Smoking Status: neverNever Smoker Second Hand Smoking status: no No smokers in home Alcohol intake: up to 14 per week-advised to cut down Other substance abuse/illicit drugs: none  Cardiac risk factors:  advanced age (older than 57 for men, 51 for women) yes no Hyperlipidemia no + Hypertension tx No diabetes. no No results found for: "HGBA1C" Family History: updated  Family History  Problem Relation Age of Onset   Hypertension Mother    Alcohol abuse Father    Diabetes Father    Hypertension Father     Depression Screen/risk evaluation Risk factors: neg.Marland Kitchen PHQ2 0 ok    06/01/2022   11:24 AM 07/19/2017   11:15 AM 07/11/2017    2:26 PM 07/05/2017   10:06 AM 07/02/2017    3:14 PM  Depression screen PHQ 2/9  Decreased Interest 0 0 0 0 0  Down, Depressed, Hopeless 0 0 0 0 0  PHQ - 2 Score 0 0 0 0 0  Altered sleeping 1      Tired, decreased energy 0      Change in appetite 0      Feeling bad or failure about yourself  0      Trouble concentrating 0      Moving slowly or fidgety/restless 0      Suicidal thoughts 0      PHQ-9 Score 1      Difficult doing work/chores Not difficult at all         Functional ability and level of safety Mobility assessment: normal timed get up and go <12 seconds Activities of Daily Living- Independent in ADLs (toileting, bathing, dressing, transferring, eating) and in IADLs (shopping, housekeeping, managing own medications, and handling finances)does fine Home Safety: Loose rugs (taken care of), smoke detectors (+), small pets (large pets), grab bars (+), stairs (+), life-alert system (neg) Hearing Difficulties: no-patient declines Fall Risk: some-more petsNone     06/01/2022   11:24 AM 07/19/2017   11:15 AM 07/11/2017    2:26 PM 07/05/2017   10:06 AM 07/02/2017    3:14 PM  Fall Risk   Falls in the past year? 1 No Yes Yes Yes  Number falls in past yr: 1  2 or more 1 2 or more  Injury with Fall? 1  Yes Yes Yes  Comment    hurt face, right leg and hip   Risk Factor Category      High Fall Risk  Risk for fall due to : No Fall Risks    History of fall(s)  Follow up Falls evaluation completed    Falls prevention discussed   Opioid use history: none no long term opioids use Self assessment of health status: great  Required Immunizations  needed today:  normal Immunization History  Administered Date(s) Administered   PFIZER(Purple Top)SARS-COV-2 Vaccination 05/25/2019, 06/16/2019, 01/30/2020   PNEUMOCOCCAL CONJUGATE-20 06/01/2022   Health Maintenance  Topic Date Due   DTaP/Tdap/Td vaccine (1 - Tdap) Never done   Pap Smear  01/31/2018   Mammogram  06/11/2019   DEXA scan (bone density measurement)  Never done   COVID-19 Vaccine (4 - 2023-24 season) 10/17/2022*   Zoster (Shingles) Vaccine (1 of 2) 10/17/2022*   Flu Shot  09/27/2022   Medicare Annual Wellness Visit  06/01/2023   Colon Cancer Screening  07/27/2024   Pneumonia Vaccine  Completed   HIV Screening  Completed   HPV Vaccine  Aged Out   Hepatitis C Screening: USPSTF Recommendation to screen - Ages 37-79 yo.  Discontinued  *Topic was postponed. The date shown is not the original due  date.    Screening tests-  Health Maintenance Due  Topic Date Due   DTaP/Tdap/Td (1 - Tdap) Never done   PAP SMEAR-Modifier  01/31/2018   MAMMOGRAM  06/11/2019   DEXA SCAN  Never done   Colon cancer screening- utd Lung Cancer screening- n/a Skin cancer screening- sees derm Prostate cancer screening No results found for: "PSA"  4. Cervical cancer screening- UpToDate per pt 5. Breast cancer screening- UpToDate per pt  The following were reviewed and entered/updated in epic: Past Medical History:  Diagnosis Date   Allergy    Anxiety    Degenerative joint disease (DJD) of lumbar spine    Hip arthritis    Per rads.    Hypertension    Skin cancer 08/12/2015   Per patient's report.    Patient Active Problem List   Diagnosis Date Noted   History of alcohol abuse 03/20/2016   Other depressive episodes 03/20/2016   Degenerative joint disease 03/20/2016   Osteoarthritis of right hip 01/24/2016   Generalized anxiety disorder 01/16/2014   Anemia 01/16/2014   Essential hypertension 01/16/2014   Past Surgical History:  Procedure Laterality Date   APPENDECTOMY     BASAL CELL CARCINOMA EXCISION  08/17/2015   From face   JOINT REPLACEMENT  2018   TOTAL HIP ARTHROPLASTY Right 01/24/2017   Procedure: RIGHT TOTAL HIP ARTHROPLASTY ANTERIOR APPROACH;  Surgeon: Samson Frederic, MD;  Location: [facility];  Service: Orthopedics;  Laterality: Right;  Needs RNFA    Family History  Problem Relation Age of Onset   Hypertension Mother    Alcohol abuse Father    Diabetes Father    Hypertension Father     Medications- reviewed and updated Current Outpatient Medications  Medication Sig Dispense Refill   ALPRAZolam (XANAX) 0.5 MG tablet Take 1 tablet (0.5 mg total) by mouth nightly as needed for sleep. NEEDS OV FOR FURTHER REFILLS. 30 tablet 5   Azelaic Acid 15 % gel Apply a thin layer on face in the morning and at bedtime as directed. 50 g 1   BIOTIN PO Take 3 each daily by mouth. Hair,   skin and nails gummy     clindamycin (CLEOCIN) 150 MG capsule TAKE 4 CAPSULES BY MOUTH 1 HOUR PRIOR TO APPOINTMENT for Dental work  0   Ibuprofen-Famotidine (DUEXIS) 800-26.6 MG TABS Take 1 tablet 3 (three) times daily as needed by mouth (pain).      IRON PO Take 1 tablet daily by mouth.      lisinopril (ZESTRIL) 10 MG tablet Take 1 tablet (10 mg total) by mouth daily. 90 tablet 3   Multiple Vitamins-Minerals (ADULT  GUMMY PO) Take 2 tablets daily by mouth.     propranolol (INDERAL) 20 MG tablet as needed.     tretinoin (RETIN-A) 0.05 % cream Apply to affected area at night as directed 20 g 9   triamcinolone cream (KENALOG) 0.1 % Apply 1 application topically 2 (two) times daily. 30 g 0   No current facility-administered medications for this visit.    Allergies-reviewed and updated Allergies  Allergen Reactions   Fluoxetine Hives and Rash   Sulfa Antibiotics Rash   Tramadol Nausea And Vomiting    Causes vomiting and bleeding esophageal  Other Reaction(s): GI Intolerance   Prozac [Fluoxetine Hcl] Hives   Adhesive [Tape] Rash   Amoxicillin Rash    Has patient had a PCN reaction causing immediate rash, facial/tongue/throat swelling, SOB or lightheadedness with hypotension: Unknown Has patient had a PCN reaction causing severe rash involving mucus membranes or skin necrosis: Unknown Has patient had a PCN reaction that required hospitalization: Unknown Has patient had a PCN reaction occurring within the last 10 years: No If all of the above answers are "NO", then may proceed with Cephalosporin use.    Erythromycin Rash    Social History   Socioeconomic History   Marital status: Married    Spouse name: Not on file   Number of children: 4   Years of education: Not on file   Highest education level: Not on file  Occupational History   Not on file  Tobacco Use   Smoking status: Never   Smokeless tobacco: Never  Vaping Use   Vaping Use: Never used  Substance and Sexual Activity    Alcohol use: Yes    Alcohol/week: 16.0 standard drinks of alcohol    Types: 8 Glasses of wine, 8 Standard drinks or equivalent per week    Comment: Socially-0-2/day   Drug use: No   Sexual activity: Yes    Birth control/protection: None    Comment: I'm 67.  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  (06/03/2022)   Overall Financial Resource Strain (CARDIA)    Difficulty of Paying Living Expenses: Not hard at all  Food Insecurity: Unknown (06/03/2022)   Hunger Vital Sign    Worried About Running Out of Food in the Last Year: Not on file    Ran Out of Food in the Last Year: Never true  Transportation Needs: Unknown (06/03/2022)   PRAPARE - Administrator, Civil Service (Medical): No    Lack of Transportation (Non-Medical): Not on file  Physical Activity: Unknown (06/03/2022)   Exercise Vital Sign    Days of Exercise per Week: 1 day    Minutes of Exercise per Session: Not on file  Stress: No Stress Concern Present (06/03/2022)   [employer] of Occupational Health - Occupational Stress Questionnaire    Feeling of Stress : Not at all  Social Connections: Not on file   Objective  Objective:  BP 132/84 (BP Location: Left Arm, Patient Position: Sitting, Cuff Size: Normal)   Pulse (!) 102   Temp 98.1 F (36.7 C) (Temporal)   Ht 5\' 5"  (1.651 m)   Wt 128 lb 8 oz (58.3 kg)   SpO2 97%   BMI 21.38 kg/m  Gen: NAD, resting comfortably HEENT: Mucous membranes are moist. Oropharynx normal Neck: no thyromegaly CV: RRR  no murmurs rubs or gallops Lungs: CTAB no crackles, wheeze, rhonchi Abdomen: soft/nontender/nondistended/normal bowel sounds. No rebound or guarding.  Ext: no edema Skin: warm, dry Neuro: grossly normal, moves all extremities, PERRLA  EKG: sinus rhythm, normal rate, without st changes.    Assessment and Plan:   Welcome to  Medicare exam completed- ordered dxa. Pneumovax 20 given.  Get Tetanus, Diphtheria, and Pertussis (Tdap) at pharmacy.  Need to get pap/mamm reports.  Advanced directive packed given to patient.  Patient to schedule mamm,dxa Educated, counseled and referred based on above elements Educated, counseled and referred as appropriate for preventative needs Discussed and documented a written plan for preventiative services and screenings with personalized health advice- After Visit Summary was given to patient which included this plan + 4. EKG offered [id number]- patient done  Status of chronic or acute concerns  Stable.  Monitor blood pressure's.   No specialty comments available.  No problem-specific Assessment & Plan notes found for this encounter.   Recommended follow up: 1 month(s) hypertension, labs.  No future appointments.   Lab/Order associations:   ICD-10-CM   1. Welcome to Medicare preventive visit  Z00.00 EKG 12-Lead    2. Osteopenia, unspecified location  M85.80 DG BONE DENSITY (DXA)    3. Post-menopausal  Z78.0 DG BONE DENSITY (DXA)    4. Need for pneumococcal 20-valent conjugate vaccination  Z23 Pneumococcal conjugate vaccine 20-valent (Prevnar 20)    5. Encounter for Medicare annual wellness exam  Z00.00       No orders of the defined types were placed in this encounter.   Return precautions advised. Angelena Sole, MD   Subjective:    Dawn Casey is a 67 y.o. female who presents for a Welcome to Medicare exam.   Review of Systems ROS: Gen: no fever, chills  Skin: no rash, itching ENT: no ear pain, ear drainage, nasal congestion, rhinorrhea, sinus pressure, sore throat Eyes: no blurry vision, double vision Resp: no cough, wheeze,SOB CV: no CP, palpitations, LE edema,  GI: no heartburn, n/v/d/c, abd pain GU: no dysuria, urgency, frequency, hematuria MSK: no joint pain, myalgias, back pain Neuro: no dizziness, headache, weakness, vertigo Psych: no  depression, anxiety, insomnia, SI         Objective:    Today's Vitals   06/01/22 1111 06/01/22 1209  BP: (!) 131/100 132/84  Pulse: (!) 102   Temp: 98.1 F (36.7 C)   TempSrc: Temporal   SpO2: 97%   Weight: 128 lb 8 oz (58.3 kg)   Height: 5\' 5"  (1.651 m)   Body mass index is 21.38 kg/m.  Medications Outpatient Encounter Medications as of 06/01/2022  Medication Sig   ALPRAZolam (XANAX) 0.5 MG tablet Take 1 tablet (0.5 mg total) by mouth nightly as needed for sleep. NEEDS OV FOR FURTHER REFILLS.   Azelaic Acid 15 % gel Apply a thin layer on face in the morning and at bedtime as directed.   BIOTIN PO Take 3 each daily by mouth. Hair,  skin and nails gummy   clindamycin (CLEOCIN) 150 MG capsule TAKE 4 CAPSULES BY MOUTH 1 HOUR PRIOR TO APPOINTMENT for Dental work   Ibuprofen-Famotidine (DUEXIS) 800-26.6 MG TABS Take 1 tablet 3 (three) times daily as needed by mouth (pain).    IRON PO Take 1 tablet daily by mouth.    lisinopril (ZESTRIL) 10 MG tablet Take 1 tablet (10 mg total) by mouth daily.   Multiple Vitamins-Minerals (ADULT GUMMY PO) Take 2 tablets daily by mouth.  propranolol (INDERAL) 20 MG tablet as needed.   tretinoin (RETIN-A) 0.05 % cream Apply to affected area at night as directed   triamcinolone cream (KENALOG) 0.1 % Apply 1 application topically 2 (two) times daily.   [DISCONTINUED] aspirin 81 MG chewable tablet Chew 1 tablet (81 mg total) by mouth 2 (two) times daily. (Patient not taking: Reported on 07/19/2017)   [DISCONTINUED] lisinopril (PRINIVIL,ZESTRIL) 10 MG tablet Take 1 tablet (10 mg total) by mouth daily.   [DISCONTINUED] lisinopril (ZESTRIL) 10 MG tablet Take 1 tablet by mouth every day   [DISCONTINUED] lisinopril (ZESTRIL) 10 MG tablet Take 1 tablet (10 mg total) by mouth daily. Patient must have office visit before next refill.   [DISCONTINUED] lisinopril (ZESTRIL) 10 MG tablet Take 1 tablet (10 mg total) by mouth daily. Patient must have office visit before  next refill.   [DISCONTINUED] nitrofurantoin, macrocrystal-monohydrate, (MACROBID) 100 MG capsule Take 1 capsule (100 mg total) by mouth 2 (two) times daily   [DISCONTINUED] propranolol (INDERAL) 20 MG tablet Take 1 tablet by mouth every day   No facility-administered encounter medications on file as of 06/01/2022.     History: Past Medical History:  Diagnosis Date   Allergy    Anxiety    Degenerative joint disease (DJD) of lumbar spine    Hip arthritis    Per rads.    Hypertension    Skin cancer 08/12/2015   Per patient's report.    Past Surgical History:  Procedure Laterality Date   APPENDECTOMY     BASAL CELL CARCINOMA EXCISION  08/17/2015   From face   JOINT REPLACEMENT  2018   TOTAL HIP ARTHROPLASTY Right 01/24/2017   Procedure: RIGHT TOTAL HIP ARTHROPLASTY ANTERIOR APPROACH;  Surgeon: Samson Frederic, MD;  Location: [facility];  Service: Orthopedics;  Laterality: Right;  Needs RNFA    Family History  Problem Relation Age of Onset   Hypertension Mother    Alcohol abuse Father    Diabetes Father    Hypertension Father    Social History   Occupational History   Not on file  Tobacco Use   Smoking status: Never   Smokeless tobacco: Never  Vaping Use   Vaping Use: Never used  Substance and Sexual Activity   Alcohol use: Yes    Alcohol/week: 16.0 standard drinks of alcohol    Types: 8 Glasses of wine, 8 Standard drinks or equivalent per week    Comment: Socially-0-2/day   Drug use: No   Sexual activity: Yes    Birth control/protection: None    Comment: I'm 67.    Tobacco Counseling Counseling given: Not Answered   Immunizations and Health Maintenance Immunization History  Administered Date(s) Administered   PFIZER(Purple Top)SARS-COV-2 Vaccination 05/25/2019, 06/16/2019, 01/30/2020   PNEUMOCOCCAL CONJUGATE-20 06/01/2022   Health Maintenance Due  Topic Date Due   DTaP/Tdap/Td (1 - Tdap) Never done   PAP SMEAR-Modifier  01/31/2018   MAMMOGRAM  06/11/2019    DEXA SCAN  Never done    Activities of Daily Living     No data to display          Physical Exam  see above.  Error and have 2 notes(optional), or other factors deemed appropriate based on the beneficiary's medical and social history and current clinical standards.  Advanced Directives:      Assessment:    This is a routine wellness examination for this patient . Welcome medicare  Vision/Hearing screen No results found.  Dietary issues and exercise activities discussed:  Goals   None   Depression Screen    06/01/2022   11:24 AM 07/19/2017   11:15 AM 07/11/2017    2:26 PM 07/05/2017   10:06 AM  PHQ 2/9 Scores  PHQ - 2 Score 0 0 0 0  PHQ- 9 Score 1        Fall Risk    06/01/2022   11:24 AM  Fall Risk   Falls in the past year? 1  Number falls in past yr: 1  Injury with Fall? 1  Risk for fall due to : No Fall Risks  Follow up Falls evaluation completed    Cognitive Function:        Patient Care Team: Jeani Sow, MD as PCP - General (Family Medicine)     Plan:   [facility] guidance.  See above  I have personally reviewed and noted the following in the patient's chart:   Medical and social history Use of alcohol, tobacco or illicit drugs  Current medications and supplements Functional ability and status Nutritional status Physical activity Advanced directives List of other physicians Hospitalizations, surgeries, and ER visits in previous 12 months Vitals Screenings to include cognitive, depression, and falls Referrals and appointments  In addition, I have reviewed and discussed with patient certain preventive protocols, quality metrics, and best practice recommendations. A written personalized care plan for preventive services as well as general preventive health recommendations were provided to patient.     Angelena Sole, MD 06/03/2022

## 2022-06-06 ENCOUNTER — Other Ambulatory Visit (HOSPITAL_COMMUNITY): Payer: Self-pay

## 2022-06-06 DIAGNOSIS — H401121 Primary open-angle glaucoma, left eye, mild stage: Secondary | ICD-10-CM | POA: Diagnosis not present

## 2022-06-06 DIAGNOSIS — H40021 Open angle with borderline findings, high risk, right eye: Secondary | ICD-10-CM | POA: Diagnosis not present

## 2022-06-06 MED ORDER — LATANOPROST 0.005 % OP SOLN
1.0000 [drp] | Freq: Every evening | OPHTHALMIC | 2 refills | Status: DC
  Filled 2022-06-06: qty 2.5, 50d supply, fill #0
  Filled 2022-07-16: qty 2.5, 50d supply, fill #1
  Filled 2022-09-06: qty 2.5, 50d supply, fill #2

## 2022-06-09 ENCOUNTER — Encounter: Payer: Self-pay | Admitting: Family Medicine

## 2022-06-09 ENCOUNTER — Other Ambulatory Visit (HOSPITAL_COMMUNITY): Payer: Self-pay

## 2022-06-09 ENCOUNTER — Other Ambulatory Visit: Payer: Self-pay | Admitting: Family Medicine

## 2022-06-11 ENCOUNTER — Other Ambulatory Visit: Payer: Self-pay

## 2022-06-11 ENCOUNTER — Other Ambulatory Visit (HOSPITAL_BASED_OUTPATIENT_CLINIC_OR_DEPARTMENT_OTHER): Payer: Self-pay | Admitting: Family Medicine

## 2022-06-11 ENCOUNTER — Other Ambulatory Visit (HOSPITAL_COMMUNITY): Payer: Self-pay

## 2022-06-11 DIAGNOSIS — Z1231 Encounter for screening mammogram for malignant neoplasm of breast: Secondary | ICD-10-CM

## 2022-06-11 MED ORDER — LISINOPRIL 10 MG PO TABS
10.0000 mg | ORAL_TABLET | Freq: Every day | ORAL | 0 refills | Status: DC
  Filled 2022-06-11: qty 90, 90d supply, fill #0

## 2022-06-11 MED ORDER — PROPRANOLOL HCL 20 MG PO TABS
20.0000 mg | ORAL_TABLET | Freq: Two times a day (BID) | ORAL | 3 refills | Status: DC | PRN
  Filled 2022-06-11: qty 30, 15d supply, fill #0
  Filled 2023-05-31: qty 30, 15d supply, fill #1

## 2022-06-29 ENCOUNTER — Encounter: Payer: Self-pay | Admitting: Family Medicine

## 2022-07-05 ENCOUNTER — Ambulatory Visit (HOSPITAL_BASED_OUTPATIENT_CLINIC_OR_DEPARTMENT_OTHER)
Admission: RE | Admit: 2022-07-05 | Discharge: 2022-07-05 | Disposition: A | Discharge status: Home / Self Care | Payer: Medicare Other | Source: Ambulatory Visit | Attending: Family Medicine | Admitting: Family Medicine

## 2022-07-05 ENCOUNTER — Inpatient Hospital Stay (HOSPITAL_BASED_OUTPATIENT_CLINIC_OR_DEPARTMENT_OTHER): Admission: RE | Admit: 2022-07-05 | Payer: Medicare Other | Source: Ambulatory Visit | Admitting: Radiology

## 2022-07-05 DIAGNOSIS — M8580 Other specified disorders of bone density and structure, unspecified site: Secondary | ICD-10-CM | POA: Diagnosis not present

## 2022-07-05 DIAGNOSIS — Z780 Asymptomatic menopausal state: Secondary | ICD-10-CM | POA: Diagnosis not present

## 2022-07-05 DIAGNOSIS — M85852 Other specified disorders of bone density and structure, left thigh: Secondary | ICD-10-CM | POA: Diagnosis not present

## 2022-07-08 NOTE — Progress Notes (Signed)
She has osteopenia-stage before osteoporosis. Continue exercise(walking, etc).  Make sure taking calcium 500 mg/vitamin D 400 mg twice daily

## 2022-07-16 ENCOUNTER — Other Ambulatory Visit: Payer: Self-pay

## 2022-07-17 ENCOUNTER — Other Ambulatory Visit (HOSPITAL_COMMUNITY): Payer: Self-pay

## 2022-07-20 ENCOUNTER — Ambulatory Visit: Payer: Medicare Other | Admitting: Family Medicine

## 2022-07-25 DIAGNOSIS — H40021 Open angle with borderline findings, high risk, right eye: Secondary | ICD-10-CM | POA: Diagnosis not present

## 2022-07-25 DIAGNOSIS — H401121 Primary open-angle glaucoma, left eye, mild stage: Secondary | ICD-10-CM | POA: Diagnosis not present

## 2022-08-01 ENCOUNTER — Encounter: Payer: Self-pay | Admitting: Family Medicine

## 2022-08-01 ENCOUNTER — Ambulatory Visit (INDEPENDENT_AMBULATORY_CARE_PROVIDER_SITE_OTHER): Payer: Medicare Other | Admitting: Family Medicine

## 2022-08-01 ENCOUNTER — Other Ambulatory Visit (HOSPITAL_COMMUNITY): Payer: Self-pay

## 2022-08-01 VITALS — BP 126/88 | HR 64 | Temp 98.3°F | Resp 16 | Ht 65.0 in | Wt 130.2 lb

## 2022-08-01 DIAGNOSIS — I10 Essential (primary) hypertension: Secondary | ICD-10-CM

## 2022-08-01 DIAGNOSIS — E559 Vitamin D deficiency, unspecified: Secondary | ICD-10-CM | POA: Insufficient documentation

## 2022-08-01 DIAGNOSIS — D500 Iron deficiency anemia secondary to blood loss (chronic): Secondary | ICD-10-CM

## 2022-08-01 DIAGNOSIS — F418 Other specified anxiety disorders: Secondary | ICD-10-CM | POA: Insufficient documentation

## 2022-08-01 MED ORDER — LISINOPRIL 10 MG PO TABS
10.0000 mg | ORAL_TABLET | Freq: Every day | ORAL | 3 refills | Status: DC
  Filled 2022-08-01 – 2022-08-03 (×2): qty 90, 90d supply, fill #0
  Filled 2022-12-05: qty 90, 90d supply, fill #1
  Filled 2023-03-01: qty 90, 90d supply, fill #2
  Filled 2023-05-31: qty 90, 90d supply, fill #3

## 2022-08-01 NOTE — Assessment & Plan Note (Signed)
Chronic.  Takes supplements.  Check CBC, iron studies, B12

## 2022-08-01 NOTE — Assessment & Plan Note (Signed)
Chronic.  Well-controlled with propranolol 20 mg twice daily as needed (rarely uses)

## 2022-08-01 NOTE — Assessment & Plan Note (Signed)
Chronic.  Controlled.  Continue lisinopril 10 mg daily.  Check labs

## 2022-08-01 NOTE — Patient Instructions (Signed)
It was very nice to see you today!  Keep active   PLEASE NOTE:  If you had any lab tests please let us know if you have not heard back within a few days. You may see your results on MyChart before we have a chance to review them but we will give you a call once they are reviewed by us. If we ordered any referrals today, please let us know if you have not heard from their office within the next week.   Please try these tips to maintain a healthy lifestyle:  Eat most of your calories during the day when you are active. Eliminate processed foods including packaged sweets (pies, cakes, cookies), reduce intake of potatoes, white bread, white pasta, and white rice. Look for whole grain options, oat flour or almond flour.  Each meal should contain half fruits/vegetables, one quarter protein, and one quarter carbs (no bigger than a computer mouse).  Cut down on sweet beverages. This includes juice, soda, and sweet tea. Also watch fruit intake, though this is a healthier sweet option, it still contains natural sugar! Limit to 3 servings daily.  Drink at least 1 glass of water with each meal and aim for at least 8 glasses per day  Exercise at least 150 minutes every week.   

## 2022-08-01 NOTE — Progress Notes (Signed)
Subjective:     Patient ID: Dawn Casey, female    DOB: 22-Aug-1955, 67 y.o.   MRN: [id number]  Chief Complaint  Patient presents with   Follow-up    1 month follow-up on HTN and labs     HPI  HTN-Pt is on lisinopril 10mg .  Bp's running  110-120/80's .  No ha/dizziness/cp/palp/edema/cough/sob  Performance anxiety-propranolol works-takes rarely. History of anemia-asymptomatic.  Taking iron supplements History of vitamin D deficiency-taking 1000 IUs/day  Health Maintenance Due  Topic Date Due   DTaP/Tdap/Td (1 - Tdap) Never done   MAMMOGRAM  06/11/2019    Past Medical History:  Diagnosis Date   Allergy    Anxiety    Degenerative joint disease (DJD) of lumbar spine    Hip arthritis    Per rads.    Hypertension    Skin cancer 08/12/2015   Per patient's report.     Past Surgical History:  Procedure Laterality Date   APPENDECTOMY     BASAL CELL CARCINOMA EXCISION  08/17/2015   From face   JOINT REPLACEMENT  2018   TOTAL HIP ARTHROPLASTY Right 01/24/2017   Procedure: RIGHT TOTAL HIP ARTHROPLASTY ANTERIOR APPROACH;  Surgeon: Samson Frederic, MD;  Location: [facility];  Service: Orthopedics;  Laterality: Right;  Needs RNFA     Current Outpatient Medications:    ALPRAZolam (XANAX) 0.5 MG tablet, Take 1 tablet (0.5 mg total) by mouth nightly as needed for sleep. NEEDS OV FOR FURTHER REFILLS., Disp: 30 tablet, Rfl: 5   Azelaic Acid 15 % gel, Apply a thin layer on face in the morning and at bedtime as directed., Disp: 50 g, Rfl: 1   BIOTIN PO, Take 3 each daily by mouth. Hair,  skin and nails gummy, Disp: , Rfl:    cholecalciferol (VITAMIN D3) 25 MCG (1000 UNIT) tablet, , Disp: , Rfl:    clindamycin (CLEOCIN) 150 MG capsule, TAKE 4 CAPSULES BY MOUTH 1 HOUR PRIOR TO APPOINTMENT for Dental work, Disp: , Rfl: 0   IRON PO, Take 1 tablet daily by mouth. , Disp: , Rfl:    latanoprost (XALATAN) 0.005 % ophthalmic solution, Place 1 drop into the left eye at bedtime., Disp: 2.5  mL, Rfl: 2   Multiple Vitamins-Minerals (ADULT GUMMY PO), Take 2 tablets daily by mouth., Disp: , Rfl:    propranolol (INDERAL) 20 MG tablet, Take 1 tablet (20 mg total) by mouth 2 (two) times daily as needed., Disp: 30 tablet, Rfl: 3   tretinoin (RETIN-A) 0.05 % cream, Apply to affected area at night as directed, Disp: 20 g, Rfl: 9   lisinopril (ZESTRIL) 10 MG tablet, Take 1 tablet (10 mg) by mouth daily., Disp: 90 tablet, Rfl: 3  Allergies  Allergen Reactions   Fluoxetine Hives and Rash   Sulfa Antibiotics Rash   Tramadol Nausea And Vomiting    Causes vomiting and bleeding esophageal  Other Reaction(s): GI Intolerance   Prozac [Fluoxetine Hcl] Hives   Adhesive [Tape] Rash   Amoxicillin Rash    Has patient had a PCN reaction causing immediate rash, facial/tongue/throat swelling, SOB or lightheadedness with hypotension: Unknown Has patient had a PCN reaction causing severe rash involving mucus membranes or skin necrosis: Unknown Has patient had a PCN reaction that required hospitalization: Unknown Has patient had a PCN reaction occurring within the last 10 years: No If all of the above answers are "NO", then may proceed with Cephalosporin use.    Erythromycin Rash   ROS neg/noncontributory except as  noted HPI/below Cat was vomiting so grabbed it and the dogs got excited and tripped over them.  Hit face and bruised on left.  Healed fine.       Objective:     BP 126/88   Pulse 64   Temp 98.3 F (36.8 C) (Temporal)   Resp 16   Ht 5\' 5"  (1.651 m)   Wt 130 lb 4 oz (59.1 kg)   SpO2 99%   BMI 21.67 kg/m  Wt Readings from Last 3 Encounters:  08/01/22 130 lb 4 oz (59.1 kg)  06/01/22 128 lb 8 oz (58.3 kg)  06/28/21 121 lb (54.9 kg)    Physical Exam   Gen: [facility]N NAD HEENT: NCAT, conjunctiva not injected, sclera nonicteric NECK:  supple, no thyromegaly, no nodes, no carotid bruits CARDIAC: RRR, S1S2+, no murmur. DP 2+B LUNGS: CTAB. No wheezes EXT:  no edema MSK: no gross  abnormalities.  NEURO: A&O x3.  CN II-XII intact.  PSYCH: normal mood. Good eye contact     Assessment & Plan:  Essential hypertension Assessment & Plan: Chronic.  Controlled.  Continue lisinopril 10 mg daily.  Check labs  Orders: -     Lipid panel -     Comprehensive metabolic panel -     CBC with Differential/Platelet -     Hemoglobin A1c -     TSH  Iron deficiency anemia due to chronic blood loss Assessment & Plan: Chronic.  Takes supplements.  Check CBC, iron studies, B12  Orders: -     Iron, TIBC and Ferritin Panel -     Vitamin B12  Vitamin D deficiency Assessment & Plan: Chronic.  Taking vitamin D at 1000 IUs/day.  Check vitamin D  Orders: -     VITAMIN D 25 Hydroxy (Vit-D Deficiency, Fractures)  Performance anxiety Assessment & Plan: Chronic.  Well-controlled with propranolol 20 mg twice daily as needed (rarely uses)   Other orders -     Lisinopril; Take 1 tablet (10 mg) by mouth daily.  Dispense: 90 tablet; Refill: 3    Return in about 1 year (around 08/01/2023) for HTN-me in 1 yr and Inetta Fermo same day for the medicare wellness.  Angelena Sole, MD

## 2022-08-01 NOTE — Assessment & Plan Note (Signed)
Chronic.  Taking vitamin D at 1000 IUs/day.  Check vitamin D

## 2022-08-02 LAB — COMPREHENSIVE METABOLIC PANEL
ALT: 25 U/L (ref 0–35)
AST: 31 U/L (ref 0–37)
Albumin: 4.3 g/dL (ref 3.5–5.2)
Alkaline Phosphatase: 68 U/L (ref 39–117)
BUN: 13 mg/dL (ref 6–23)
CO2: 26 mEq/L (ref 19–32)
Calcium: 9.6 mg/dL (ref 8.4–10.5)
Chloride: 103 mEq/L (ref 96–112)
Creatinine, Ser: 0.74 mg/dL (ref 0.40–1.20)
GFR: 84.49 mL/min (ref >60.00)
Glucose, Bld: 94 mg/dL (ref 70–99)
Potassium: 4.1 mEq/L (ref 3.5–5.1)
Sodium: 139 mEq/L (ref 135–145)
Total Bilirubin: 0.5 mg/dL (ref 0.2–1.2)
Total Protein: 6.5 g/dL (ref 6.0–8.3)

## 2022-08-02 LAB — CBC WITH DIFFERENTIAL/PLATELET
Basophils Absolute: 0 10*3/uL (ref 0.0–0.1)
Basophils Relative: 0.8 % (ref 0.0–3.0)
Eosinophils Absolute: 0.2 10*3/uL (ref 0.0–0.7)
Eosinophils Relative: 2.6 % (ref 0.0–5.0)
HCT: 38.1 % (ref 36.0–46.0)
Hemoglobin: 12.7 g/dL (ref 12.0–15.0)
Lymphocytes Relative: 29.7 % (ref 12.0–46.0)
Lymphs Abs: 1.8 10*3/uL (ref 0.7–4.0)
MCHC: 33.3 g/dL (ref 30.0–36.0)
MCV: 103.7 fl — ABNORMAL HIGH (ref 78.0–100.0)
Monocytes Absolute: 0.5 10*3/uL (ref 0.1–1.0)
Monocytes Relative: 8.1 % (ref 3.0–12.0)
Neutro Abs: 3.5 10*3/uL (ref 1.4–7.7)
Neutrophils Relative %: 58.8 % (ref 43.0–77.0)
Platelets: 300 10*3/uL (ref 150.0–400.0)
RBC: 3.68 Mil/uL — ABNORMAL LOW (ref 3.87–5.11)
RDW: 12.2 % (ref 11.5–15.5)
WBC: 5.9 10*3/uL (ref 4.0–10.5)

## 2022-08-02 LAB — VITAMIN B12: Vitamin B-12: 466 pg/mL (ref 211–911)

## 2022-08-02 LAB — LIPID PANEL
Cholesterol: 180 mg/dL (ref 0–200)
HDL: 61.3 mg/dL (ref >39.00)
LDL Cholesterol: 102 mg/dL — ABNORMAL HIGH (ref 0–99)
NonHDL: 118.88
Total CHOL/HDL Ratio: 3
Triglycerides: 82 mg/dL (ref 0.0–149.0)
VLDL: 16.4 mg/dL (ref 0.0–40.0)

## 2022-08-02 LAB — HEMOGLOBIN A1C: Hgb A1c MFr Bld: 4.8 % (ref 4.6–6.5)

## 2022-08-02 LAB — IRON,TIBC AND FERRITIN PANEL
%SAT: 20 % (calc) (ref 16–45)
Ferritin: 247 ng/mL (ref 16–288)
Iron: 60 ug/dL (ref 45–160)
TIBC: 306 mcg/dL (calc) (ref 250–450)

## 2022-08-02 LAB — VITAMIN D 25 HYDROXY (VIT D DEFICIENCY, FRACTURES): VITD: 91.25 ng/mL (ref 30.00–100.00)

## 2022-08-02 LAB — TSH: TSH: 0.98 u[IU]/mL (ref 0.35–5.50)

## 2022-08-03 ENCOUNTER — Other Ambulatory Visit (HOSPITAL_COMMUNITY): Payer: Self-pay

## 2022-08-05 NOTE — Progress Notes (Signed)
Labs look great! Vitamin D is too high.  Stop taking vitamin D supplements

## 2022-09-06 ENCOUNTER — Other Ambulatory Visit (HOSPITAL_COMMUNITY): Payer: Self-pay

## 2022-09-07 ENCOUNTER — Other Ambulatory Visit: Payer: Self-pay

## 2022-09-07 ENCOUNTER — Other Ambulatory Visit (HOSPITAL_COMMUNITY): Payer: Self-pay

## 2022-09-07 ENCOUNTER — Encounter (HOSPITAL_COMMUNITY): Payer: Self-pay

## 2022-09-07 MED ORDER — TRETINOIN 0.05 % EX CREA
1.0000 | TOPICAL_CREAM | Freq: Every day | CUTANEOUS | 3 refills | Status: DC
  Filled 2022-09-07: qty 20, 30d supply, fill #0
  Filled 2022-10-08: qty 20, 20d supply, fill #0

## 2022-09-07 MED ORDER — LATANOPROST 0.005 % OP SOLN
1.0000 [drp] | Freq: Every day | OPHTHALMIC | 3 refills | Status: DC
  Filled 2022-09-07: qty 2.5, 50d supply, fill #0

## 2022-09-10 ENCOUNTER — Other Ambulatory Visit (HOSPITAL_COMMUNITY): Payer: Self-pay

## 2022-09-10 MED ORDER — LATANOPROST 0.005 % OP SOLN
1.0000 [drp] | Freq: Every day | OPHTHALMIC | 3 refills | Status: DC
  Filled 2022-09-10 – 2022-10-21 (×2): qty 2.5, 50d supply, fill #0
  Filled 2022-12-06: qty 2.5, 50d supply, fill #1

## 2022-09-11 ENCOUNTER — Ambulatory Visit (HOSPITAL_BASED_OUTPATIENT_CLINIC_OR_DEPARTMENT_OTHER)
Admission: RE | Admit: 2022-09-11 | Discharge: 2022-09-11 | Disposition: A | Discharge status: Home / Self Care | Payer: Medicare Other | Source: Ambulatory Visit | Attending: Family Medicine | Admitting: Family Medicine

## 2022-09-11 ENCOUNTER — Other Ambulatory Visit (HOSPITAL_COMMUNITY): Payer: Self-pay

## 2022-09-11 DIAGNOSIS — Z1231 Encounter for screening mammogram for malignant neoplasm of breast: Secondary | ICD-10-CM | POA: Insufficient documentation

## 2022-09-12 ENCOUNTER — Ambulatory Visit (HOSPITAL_BASED_OUTPATIENT_CLINIC_OR_DEPARTMENT_OTHER): Payer: Medicare Other | Admitting: Radiology

## 2022-09-13 ENCOUNTER — Other Ambulatory Visit (HOSPITAL_COMMUNITY): Payer: Self-pay

## 2022-09-17 IMAGING — CT CT CHEST W/ CM
2 of 3 series · 15 of 36 positions shown, 18 images · IV contrast (agent unspecified)
Comparison: Radiograph 06/26/2021

CLINICAL DATA: Fall with left-sided rib pain

EXAM:
CT CHEST WITH CONTRAST
TECHNIQUE: Multidetector CT imaging of the chest was performed during
intravenous contrast administration.

[Series 2: axial st · axial · 0.63mm/px · z∈[-290,-26]mm · 12 of 156 slices shown, 15 images]
[im 12/156  mediastinal]
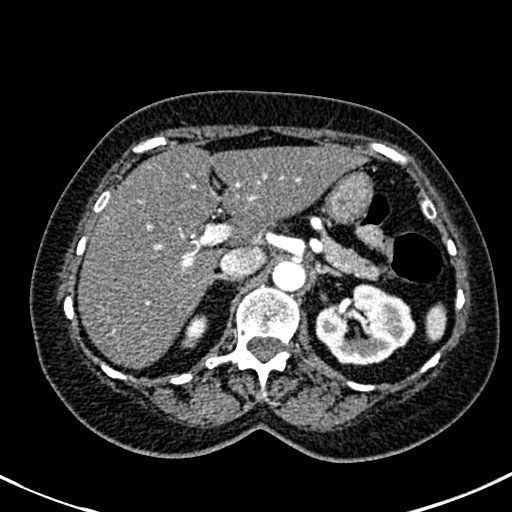
[im 12/156  lung]
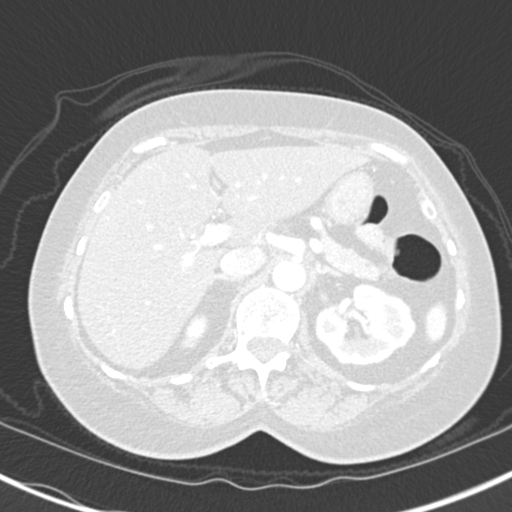
[im 23/156  lung]
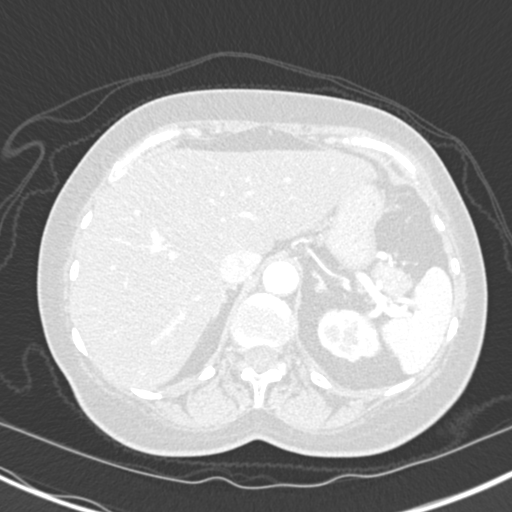
[im 35/156  lung]
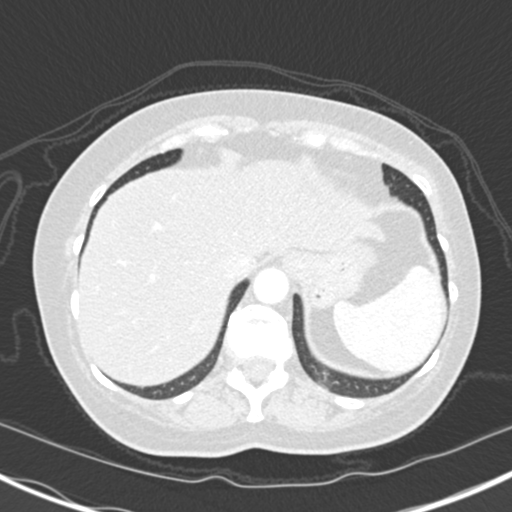
[im 46/156  lung]
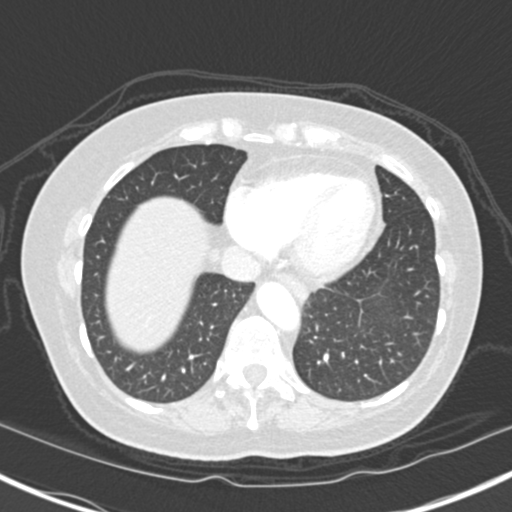
[im 58/156  mediastinal]
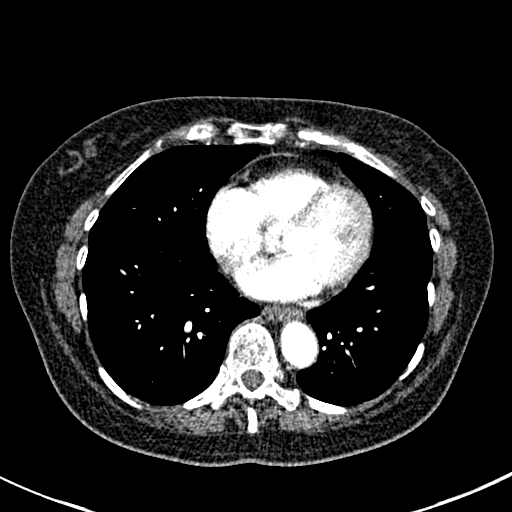
[im 58/156  lung]
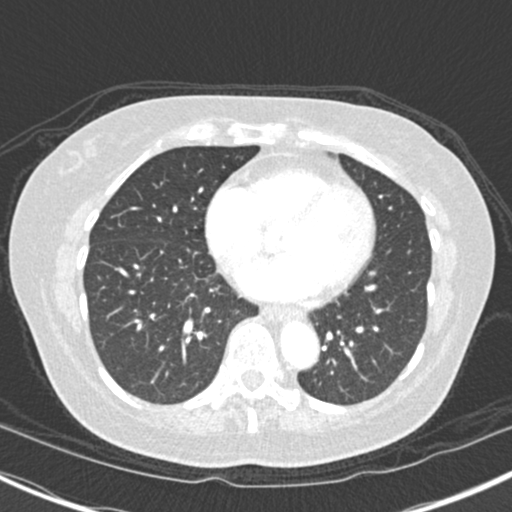
[im 69/156  lung]
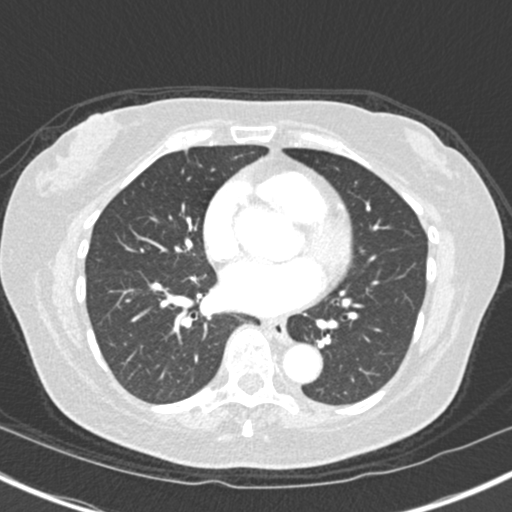
[im 87/156  lung]
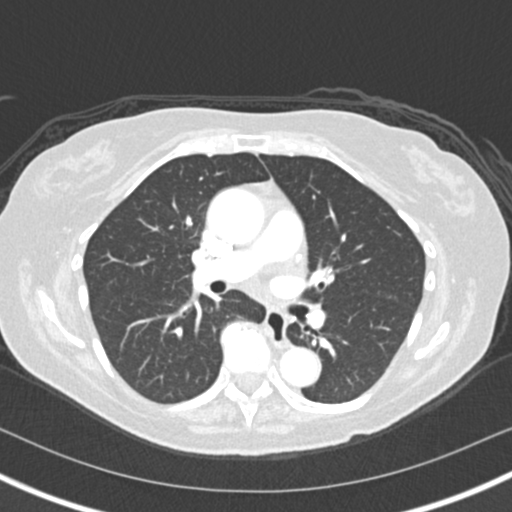
[im 98/156  lung]
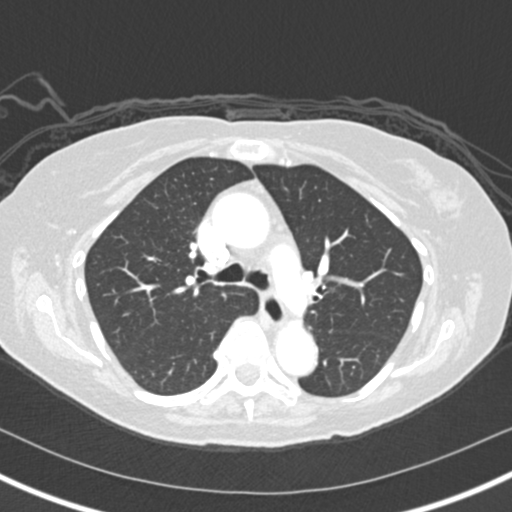
[im 110/156  mediastinal]
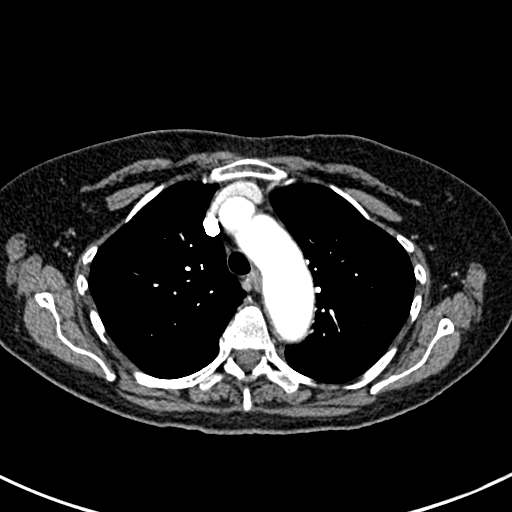
[im 110/156  lung]
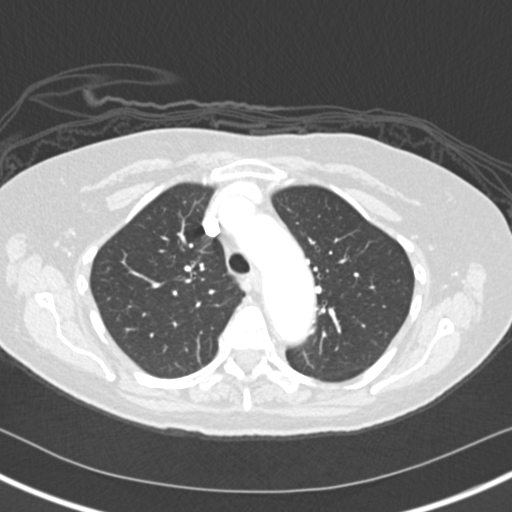
[im 121/156  lung]
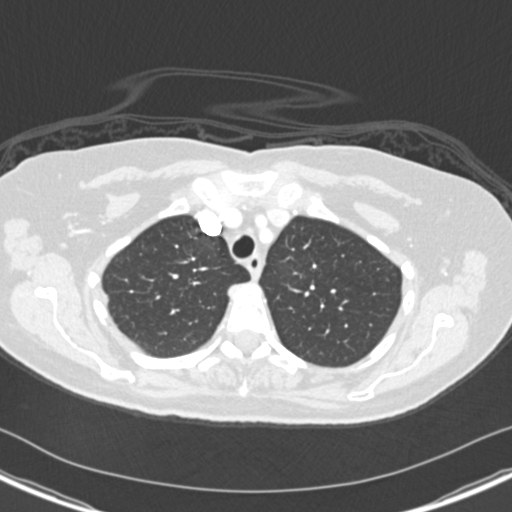
[im 133/156  lung]
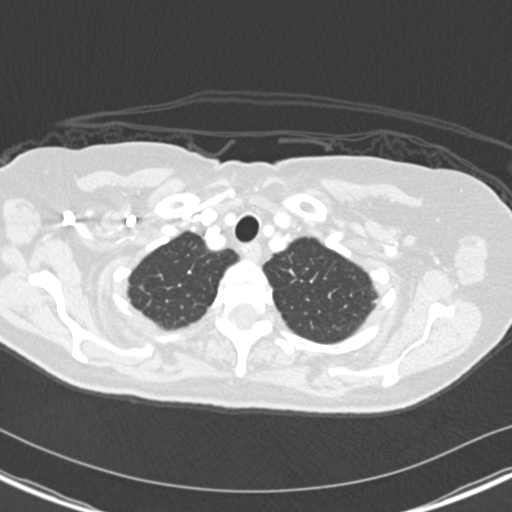
[im 144/156  lung]
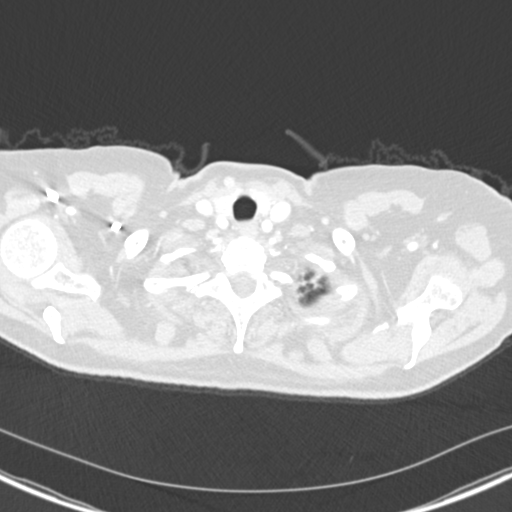

[Series 6: coronal · coronal · 0.67mm/px · 3 of 115 slices shown]
[im 23/115  lung]
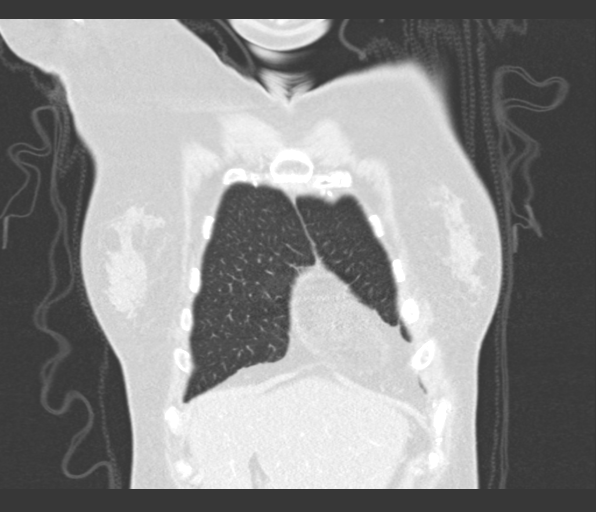
[im 46/115  lung]
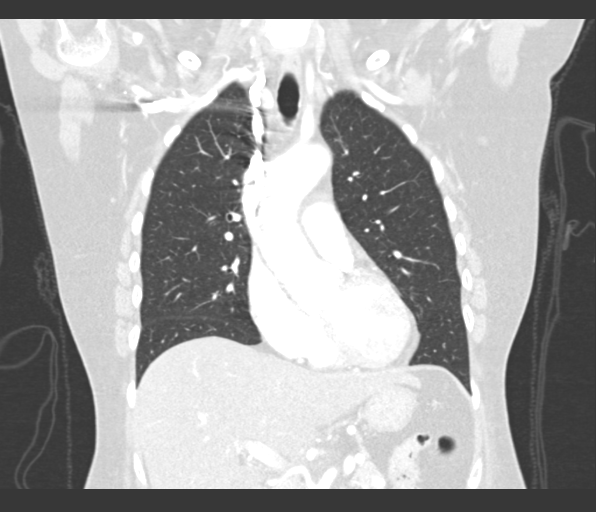
[im 69/115  lung]
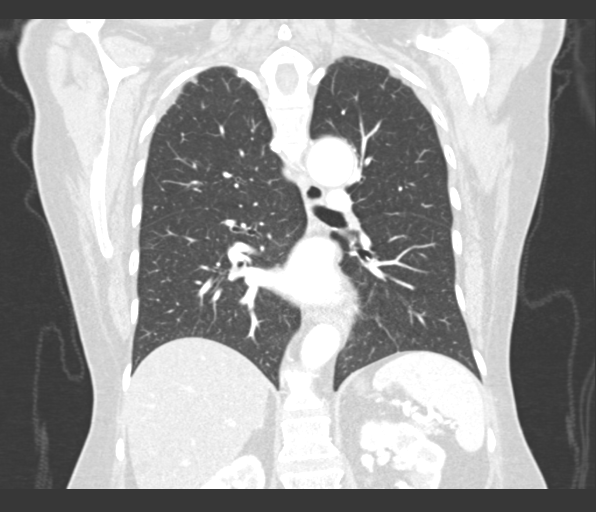

[15 of 36 positions shown; findings below may reference images not displayed]

RADIATION DOSE REDUCTION: This exam was performed according to the
departmental dose-optimization program which includes automated
exposure control, adjustment of the mA and/or kV according to
patient size and/or use of iterative reconstruction technique.

CONTRAST:  75mL OMNIPAQUE IOHEXOL 300 MG/ML  SOLN
FINDINGS: Cardiovascular: Nonaneurysmal aorta. No dissection. Normal aortic
contour. Normal cardiac size. No pericardial effusion

Mediastinum/Nodes: No enlarged mediastinal, hilar, or axillary lymph
nodes. Thyroid gland, trachea, and esophagus demonstrate no
significant findings.

Lungs/Pleura: Lungs are clear. No pleural effusion or pneumothorax.

Upper Abdomen: Hepatic steatosis. 12 mm hypodense lesion in the
right hepatic lobe possibly a small cyst or hemangioma.

Musculoskeletal: No chest wall abnormality. No acute or significant
osseous findings.
IMPRESSION: 1. No CT evidence for acute intrathoracic abnormality.
2. Hepatic steatosis. 12 mm hypodense lesion in the right hepatic
lobe possibly a small cyst or hemangioma, consider correlation
attempt with nonemergent abdominal ultrasound.

Aortic Atherosclerosis ([id number]).

## 2022-10-08 ENCOUNTER — Other Ambulatory Visit (HOSPITAL_COMMUNITY): Payer: Self-pay

## 2022-10-21 ENCOUNTER — Other Ambulatory Visit (HOSPITAL_COMMUNITY): Payer: Self-pay

## 2022-10-22 ENCOUNTER — Other Ambulatory Visit (HOSPITAL_COMMUNITY): Payer: Self-pay

## 2022-10-22 ENCOUNTER — Other Ambulatory Visit: Payer: Self-pay

## 2022-11-12 DIAGNOSIS — H401122 Primary open-angle glaucoma, left eye, moderate stage: Secondary | ICD-10-CM | POA: Diagnosis not present

## 2022-11-12 DIAGNOSIS — H40001 Preglaucoma, unspecified, right eye: Secondary | ICD-10-CM | POA: Diagnosis not present

## 2022-11-13 ENCOUNTER — Other Ambulatory Visit (HOSPITAL_COMMUNITY): Payer: Self-pay

## 2022-11-13 MED ORDER — LATANOPROST 0.005 % OP SOLN
1.0000 [drp] | Freq: Every evening | OPHTHALMIC | 11 refills | Status: DC
  Filled 2022-11-13 – 2022-12-05 (×2): qty 2.5, 50d supply, fill #0

## 2022-12-05 ENCOUNTER — Other Ambulatory Visit (HOSPITAL_COMMUNITY): Payer: Self-pay

## 2022-12-05 ENCOUNTER — Other Ambulatory Visit: Payer: Self-pay | Admitting: Family Medicine

## 2022-12-05 MED ORDER — ALPRAZOLAM 0.5 MG PO TABS
ORAL_TABLET | ORAL | 5 refills | Status: DC
  Filled 2022-12-05: qty 30, 30d supply, fill #0
  Filled 2023-01-23 – 2023-01-25 (×2): qty 30, 30d supply, fill #1
  Filled 2023-03-01: qty 30, 30d supply, fill #2
  Filled 2023-04-08: qty 30, 30d supply, fill #3
  Filled 2023-05-07: qty 30, 30d supply, fill #4
  Filled 2023-05-28 – 2023-05-31 (×2): qty 30, 30d supply, fill #5

## 2022-12-06 ENCOUNTER — Other Ambulatory Visit: Payer: Self-pay

## 2022-12-06 ENCOUNTER — Other Ambulatory Visit (HOSPITAL_COMMUNITY): Payer: Self-pay

## 2022-12-10 ENCOUNTER — Other Ambulatory Visit (HOSPITAL_COMMUNITY): Payer: Self-pay

## 2022-12-26 ENCOUNTER — Encounter: Payer: Self-pay | Admitting: *Deleted

## 2023-01-23 ENCOUNTER — Other Ambulatory Visit (HOSPITAL_COMMUNITY): Payer: Self-pay

## 2023-01-25 ENCOUNTER — Other Ambulatory Visit (HOSPITAL_COMMUNITY): Payer: Self-pay

## 2023-01-25 ENCOUNTER — Other Ambulatory Visit: Payer: Self-pay

## 2023-01-26 ENCOUNTER — Other Ambulatory Visit: Payer: Self-pay

## 2023-01-28 ENCOUNTER — Other Ambulatory Visit (HOSPITAL_COMMUNITY): Payer: Self-pay

## 2023-01-28 ENCOUNTER — Encounter (HOSPITAL_COMMUNITY): Payer: Self-pay

## 2023-03-01 ENCOUNTER — Other Ambulatory Visit: Payer: Self-pay

## 2023-03-01 ENCOUNTER — Other Ambulatory Visit (HOSPITAL_COMMUNITY): Payer: Self-pay

## 2023-03-02 ENCOUNTER — Other Ambulatory Visit (HOSPITAL_COMMUNITY): Payer: Self-pay

## 2023-04-08 ENCOUNTER — Other Ambulatory Visit (HOSPITAL_COMMUNITY): Payer: Self-pay

## 2023-04-08 ENCOUNTER — Other Ambulatory Visit: Payer: Self-pay

## 2023-04-09 ENCOUNTER — Other Ambulatory Visit (HOSPITAL_COMMUNITY): Payer: Self-pay

## 2023-04-16 ENCOUNTER — Other Ambulatory Visit (HOSPITAL_COMMUNITY): Payer: Self-pay

## 2023-04-16 ENCOUNTER — Encounter: Payer: Self-pay | Admitting: Family Medicine

## 2023-04-16 ENCOUNTER — Other Ambulatory Visit: Payer: Self-pay | Admitting: Family Medicine

## 2023-04-16 ENCOUNTER — Other Ambulatory Visit: Payer: Self-pay

## 2023-04-16 ENCOUNTER — Other Ambulatory Visit (HOSPITAL_BASED_OUTPATIENT_CLINIC_OR_DEPARTMENT_OTHER): Payer: Self-pay

## 2023-04-16 MED ORDER — CLINDAMYCIN HCL 150 MG PO CAPS
600.0000 mg | ORAL_CAPSULE | Freq: Once | ORAL | 1 refills | Status: AC
  Filled 2023-04-16: qty 8, 2d supply, fill #0

## 2023-05-03 DIAGNOSIS — D2371 Other benign neoplasm of skin of right lower limb, including hip: Secondary | ICD-10-CM | POA: Diagnosis not present

## 2023-05-03 DIAGNOSIS — L814 Other melanin hyperpigmentation: Secondary | ICD-10-CM | POA: Diagnosis not present

## 2023-05-03 DIAGNOSIS — I781 Nevus, non-neoplastic: Secondary | ICD-10-CM | POA: Diagnosis not present

## 2023-05-03 DIAGNOSIS — D2372 Other benign neoplasm of skin of left lower limb, including hip: Secondary | ICD-10-CM | POA: Diagnosis not present

## 2023-05-03 DIAGNOSIS — D1801 Hemangioma of skin and subcutaneous tissue: Secondary | ICD-10-CM | POA: Diagnosis not present

## 2023-05-03 DIAGNOSIS — L821 Other seborrheic keratosis: Secondary | ICD-10-CM | POA: Diagnosis not present

## 2023-05-07 ENCOUNTER — Other Ambulatory Visit (HOSPITAL_COMMUNITY): Payer: Self-pay

## 2023-05-13 DIAGNOSIS — H40001 Preglaucoma, unspecified, right eye: Secondary | ICD-10-CM | POA: Diagnosis not present

## 2023-05-13 DIAGNOSIS — H401122 Primary open-angle glaucoma, left eye, moderate stage: Secondary | ICD-10-CM | POA: Diagnosis not present

## 2023-05-28 ENCOUNTER — Other Ambulatory Visit (HOSPITAL_COMMUNITY): Payer: Self-pay

## 2023-05-31 ENCOUNTER — Other Ambulatory Visit (HOSPITAL_COMMUNITY): Payer: Self-pay

## 2023-05-31 ENCOUNTER — Other Ambulatory Visit: Payer: Self-pay

## 2023-06-05 ENCOUNTER — Other Ambulatory Visit: Payer: Self-pay | Admitting: Family Medicine

## 2023-06-05 ENCOUNTER — Other Ambulatory Visit (HOSPITAL_COMMUNITY): Payer: Self-pay

## 2023-06-05 MED ORDER — ALPRAZOLAM 0.5 MG PO TABS
ORAL_TABLET | ORAL | 0 refills | Status: DC
  Filled 2023-06-05: qty 30, 30d supply, fill #0

## 2023-06-05 NOTE — Telephone Encounter (Signed)
 Needs appt

## 2023-06-08 ENCOUNTER — Other Ambulatory Visit (HOSPITAL_COMMUNITY): Payer: Self-pay

## 2023-06-12 NOTE — Telephone Encounter (Signed)
 Please see patient response and advise.

## 2023-07-09 ENCOUNTER — Other Ambulatory Visit (HOSPITAL_COMMUNITY): Payer: Self-pay

## 2023-07-09 ENCOUNTER — Telehealth (INDEPENDENT_AMBULATORY_CARE_PROVIDER_SITE_OTHER): Admitting: Family Medicine

## 2023-07-09 ENCOUNTER — Encounter: Payer: Self-pay | Admitting: Family Medicine

## 2023-07-09 DIAGNOSIS — I10 Essential (primary) hypertension: Secondary | ICD-10-CM

## 2023-07-09 DIAGNOSIS — F5101 Primary insomnia: Secondary | ICD-10-CM

## 2023-07-09 MED ORDER — ALPRAZOLAM 0.5 MG PO TABS
ORAL_TABLET | ORAL | 0 refills | Status: DC
Start: 2023-07-09 — End: 2023-10-14
  Filled 2023-07-09: qty 90, 90d supply, fill #0

## 2023-07-09 MED ORDER — LISINOPRIL 10 MG PO TABS
10.0000 mg | ORAL_TABLET | Freq: Every day | ORAL | 0 refills | Status: DC
Start: 2023-07-09 — End: 2023-10-14
  Filled 2023-07-09 – 2023-09-29 (×2): qty 90, 90d supply, fill #0

## 2023-07-09 NOTE — Progress Notes (Signed)
 MyChart Video Visit Virtual Visit via Video Note   This visit type was conducted w/patient consent. This format is felt to be most appropriate for this patient at this time. Physical exam was limited by quality of the video and audio technology used for the visit. CMA was able to get the patient set up on a video visit.  Patient location: Home. Patient and provider in visit Provider location: Office  I discussed the limitations of evaluation and management by telemedicine and the availability of in person appointments. The patient expressed understanding and agreed to proceed.  Visit Date: 07/09/2023  Today's healthcare provider: Ellsworth Haas, MD     Subjective:    Patient ID: Dawn Casey, female    DOB: 1955-12-24, 69 y.o.   MRN: [id number]  Chief Complaint  Patient presents with   Medication Refill    HPI Discussed the use of AI scribe software for clinical note transcription with the patient, who gave verbal consent to proceed.  History of Present Illness Dawn Casey "Dawn Casey" is a 68 year old female who presents for medication management and follow-up.  She is here to discuss her use of alprazolam  for insomnia. She has a history of severe insomnia and has been using alprazolam  nightly for several years. 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 No falls or drowsiness from the medication.  No SI.  Happy w/this regimen  She discusses recent personal stressors, including marital issues that began in November when her husband sought a divorce. This period was marked by significant stress, poor nutrition, and lack of sleep, leading to a temporary weight gain of about four pounds. She has since resumed exercising and improved her diet, noting that her nutrition is now 'phenomenally better.'  She takes lisinopril  daily for hypertension, with her last recorded  blood pressure being 126/93 mmHg. Her diastolic pressure tends to run high, and she is managing her blood pressure through exercise, diet, and hydration. She does not currently take propranolol  regularly but plans to use it for performance anxiety during voice lessons instead of alprazolam .  No thoughts of suicide despite recent stressors. 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 She has a supply of propranolol  and does not require a refill at this time.  Her husband recently faced employment changes, which added to her stress. He was laid off but has since found employment at [employer], working long hours but only three days a week.       Past Medical History:  Diagnosis Date   Allergy    Anxiety    Degenerative joint disease (DJD) of lumbar spine    Hip arthritis    Per rads.    Hypertension    Skin cancer 08/12/2015   Per patient's report.     Past Surgical History:  Procedure Laterality Date   APPENDECTOMY     BASAL CELL CARCINOMA EXCISION  08/17/2015   From face   JOINT REPLACEMENT  2018   TOTAL HIP ARTHROPLASTY Right 01/24/2017   Procedure: RIGHT TOTAL HIP ARTHROPLASTY ANTERIOR APPROACH;  Surgeon: Adonica Hoose, MD;  Location: [facility];  Service: Orthopedics;  Laterality: Right;  Needs RNFA    Outpatient Medications Prior to Visit  Medication Sig Dispense Refill   Azelaic Acid  15 % gel Apply a thin layer on face in the morning  and at bedtime as directed. 50 g 1   BIOTIN PO Take 3 each daily by mouth. Hair,  skin and nails gummy     latanoprost  (XALATAN ) 0.005 % ophthalmic solution Apply 1 drop to eye.     Multiple Vitamins-Minerals (ADULT GUMMY PO) Take 2 tablets daily by mouth.     propranolol  (INDERAL ) 20 MG tablet Take 1 tablet (20 mg total) by mouth 2 (two) times daily as needed. 30 tablet 3   tretinoin  (RETIN-A ) 0.05 % cream Apply topically.     ALPRAZolam  (XANAX ) 0.5 MG tablet Take 1 tablet (0.5  mg total) by mouth nightly as needed for sleep. NEEDS OV FOR FURTHER REFILLS. 30 tablet 0   lisinopril  (ZESTRIL ) 10 MG tablet Take 1 tablet (10 mg) by mouth daily. 90 tablet 3   cholecalciferol (VITAMIN D3) 25 MCG (1000 UNIT) tablet      IRON PO Take 1 tablet daily by mouth.      latanoprost  (XALATAN ) 0.005 % ophthalmic solution Place 1 drop into the left eye at bedtime. 2.5 mL 2   latanoprost  (XALATAN ) 0.005 % ophthalmic solution Place 1 drop into the left eye at bedtime. 2.5 mL 3   No facility-administered medications prior to visit.    Allergies  Allergen Reactions   Fluoxetine Hives and Rash   Sulfa Antibiotics Rash   Tramadol Nausea And Vomiting    Causes vomiting and bleeding esophageal  Other Reaction(s): GI Intolerance   Prozac [Fluoxetine Hcl] Hives   Adhesive [Tape] Rash   Amoxicillin Rash    Has patient had a PCN reaction causing immediate rash, facial/tongue/throat swelling, SOB or lightheadedness with hypotension: Unknown Has patient had a PCN reaction causing severe rash involving mucus membranes or skin necrosis: Unknown Has patient had a PCN reaction that required hospitalization: Unknown Has patient had a PCN reaction occurring within the last 10 years: No If all of the above answers are "NO", then may proceed with Cephalosporin use.    Erythromycin Rash        Objective:      Physical Exam  Vitals and nursing note reviewed.  Constitutional:      General:  is not in acute distress.    Appearance: Normal appearance.  HENT:     Head: Normocephalic.  Pulmonary:     Effort: No respiratory distress.  Skin:    General: Skin is dry.     Coloration: Skin is not pale.  Neurological:     Mental Status: Pt is alert and oriented to person, place, and time.  Psychiatric:        Mood and Affect: Mood normal.   There were no vitals taken for this visit.  Wt Readings from Last 3 Encounters:  08/01/22 130 lb 4 oz (59.1 kg)  06/01/22 128 lb 8 oz (58.3 kg)   06/28/21 121 lb (54.9 kg)       Assessment & Plan:   Problem List Items Addressed This Visit     Essential hypertension - Primary   Relevant Medications   lisinopril  (ZESTRIL ) 10 MG tablet   Primary insomnia   Relevant Medications   ALPRAZolam  (XANAX ) 0.5 MG tablet  Assessment and Plan Assessment & Plan Depression   Depression is exacerbated by marital stress and potential divorce. Her mood and stability have improved after resuming therapy. There is no suicidal ideation. She is engaging in healthy lifestyle changes, including exercise and improved nutrition.  Insomnia   Chronic insomnia is managed with nightly alprazolam . She experiences  no side effects such as drowsiness or falls. She is aware of benzodiazepine risks in older adults, including prolonged metabolite presence and increased fall risk. There are no issues with the current regimen. DEA regulations requiring regular visits for controlled substances were discussed. Refill alprazolam  with a 90-day supply at [facility].  Hypertension   Hypertension is managed with lisinopril . A recent blood pressure reading is 126/93 mmHg, with diastolic slightly elevated but consistent with typical readings. She is implementing lifestyle modifications including exercise and diet. Refill lisinopril  before the August appointment.    Meds ordered this encounter  Medications   lisinopril  (ZESTRIL ) 10 MG tablet    Sig: Take 1 tablet (10 mg) by mouth daily.    Dispense:  90 tablet    Refill:  0   ALPRAZolam  (XANAX ) 0.5 MG tablet    Sig: Take 1 tablet (0.5 mg total) by mouth nightly as needed for sleep. NEEDS OV FOR FURTHER REFILLS.    Dispense:  90 tablet    Refill:  0    I discussed the assessment and treatment plan with the patient. The patient was provided an opportunity to ask questions and all were answered. The patient agreed with the plan and demonstrated an understanding of the instructions.   The patient was advised to  call back or seek an in-person evaluation if the symptoms worsen or if the condition fails to improve as anticipated.  Return for as sch in August.  Ellsworth Haas, MD [facility] [phone] (phone) [phone] (fax)  [facility]

## 2023-07-09 NOTE — Patient Instructions (Signed)

## 2023-07-10 ENCOUNTER — Other Ambulatory Visit: Payer: Self-pay

## 2023-07-11 ENCOUNTER — Other Ambulatory Visit (HOSPITAL_COMMUNITY): Payer: Self-pay

## 2023-07-12 ENCOUNTER — Telehealth: Admitting: Nurse Practitioner

## 2023-07-12 DIAGNOSIS — H938X1 Other specified disorders of right ear: Secondary | ICD-10-CM

## 2023-07-12 NOTE — Progress Notes (Signed)
 E-Visit for Upper Respiratory Infection   We are sorry you are not feeling well.  Here is how we plan to help!  It sounds like you may have gotten water in your ear while showering, and what can happen is that the water can get trapped behind the wax in your ear. I would recommend purchasing Debrox over the counter ear drops. This will help loosen the wax and allow your ear canal to be open.   I don't think at this point you need antibiotic drops unless pain returns.  As for your nasal congestion it may be that you have the start of a cold/virus and instructions for management of those symptoms are below.    Based on what you have shared with me, it looks like you may have a viral upper respiratory infection.  Upper respiratory infections are caused by a large number of viruses; however, rhinovirus is the most common cause.   Symptoms vary from person to person, with common symptoms including sore throat, cough, fatigue or lack of energy and feeling of general discomfort.  A low-grade fever of up to 100.4 may present, but is often uncommon.  Symptoms vary however, and are closely related to a person's age or underlying illnesses.  The most common symptoms associated with an upper respiratory infection are nasal discharge or congestion, cough, sneezing, headache and pressure in the ears and face.  These symptoms usually persist for about 3 to 10 days, but can last up to 2 weeks.  It is important to know that upper respiratory infections do not cause serious illness or complications in most cases.    Upper respiratory infections can be transmitted from person to person, with the most common method of transmission being a person's hands.  The virus is able to live on the skin and can infect other persons for up to 2 hours after direct contact.  Also, these can be transmitted when someone coughs or sneezes; thus, it is important to cover the mouth to reduce this risk.  To keep the spread of the illness at  bay, good hand hygiene is very important.  This is an infection that is most likely caused by a virus. There are no specific treatments other than to help you with the symptoms until the infection runs its course.  We are sorry you are not feeling well.  Here is how we plan to help!   For nasal congestion, you may use an oral decongestants such as Mucinex D or if you have glaucoma or high blood pressure use plain Mucinex.  Saline nasal spray or nasal drops can help and can safely be used as often as needed for congestion.    If you do not have a history of heart disease, hypertension, diabetes or thyroid  disease, prostate/bladder issues or glaucoma, you may also use Sudafed to treat nasal congestion.  It is highly recommended that you consult with a pharmacist or your primary care physician to ensure this medication is safe for you to take.     If you have a cough, you may use cough suppressants such as Delsym and Robitussin.  If you have glaucoma or high blood pressure, you can also use Coricidin HBP.     If you have a sore or scratchy throat, use a saltwater gargle-  to  teaspoon of salt dissolved in a 4-ounce to 8-ounce glass of warm water.  Gargle the solution for approximately 15-30 seconds and then spit.  It is important not  to swallow the solution.  You can also use throat lozenges/cough drops and Chloraseptic spray to help with throat pain or discomfort.  Warm or cold liquids can also be helpful in relieving throat pain.  For headache, pain or general discomfort, you can use Ibuprofen or Tylenol  as directed.   Some authorities believe that zinc sprays or the use of Echinacea may shorten the course of your symptoms.   HOME CARE Only take medications as instructed by your medical team. Be sure to drink plenty of fluids. Water is fine as well as fruit juices, sodas and electrolyte beverages. You may want to stay away from caffeine or alcohol . If you are nauseated, try taking small sips of  liquids. How do you know if you are getting enough fluid? Your urine should be a pale yellow or almost colorless. Get rest. Taking a steamy shower or using a humidifier may help nasal congestion and ease sore throat pain. You can place a towel over your head and breathe in the steam from hot water coming from a faucet. Using a saline nasal spray works much the same way. Cough drops, hard candies and sore throat lozenges may ease your cough. Avoid close contacts especially the very young and the elderly Cover your mouth if you cough or sneeze Always remember to wash your hands.   GET HELP RIGHT AWAY IF: You develop worsening fever. If your symptoms do not improve within 10 days You develop yellow or green discharge from your nose over 3 days. You have coughing fits You develop a severe head ache or visual changes. You develop shortness of breath, difficulty breathing or start having chest pain Your symptoms persist after you have completed your treatment plan  MAKE SURE YOU  Understand these instructions. Will watch your condition. Will get help right away if you are not doing well or get worse.  Thank you for choosing an e-visit.  Your e-visit answers were reviewed by a board certified advanced clinical practitioner to complete your personal care plan. Depending upon the condition, your plan could have included both over the counter or prescription medications.  Please review your pharmacy choice. Make sure the pharmacy is open so you can pick up prescription now. If there is a problem, you may contact your provider through [employer] and have the prescription routed to another pharmacy.  Your safety is important to us . If you have drug allergies check your prescription carefully.   For the next 24 hours you can use MyChart to ask questions about today's visit, request a non-urgent call back, or ask for a work or school excuse. You will get an email in the next two days asking  about your experience. I hope that your e-visit has been valuable and will speed your recovery.  I spent approximately 5 minutes reviewing the patient's history, current symptoms and coordinating their care today.

## 2023-07-18 ENCOUNTER — Other Ambulatory Visit (HOSPITAL_COMMUNITY): Payer: Self-pay

## 2023-07-19 ENCOUNTER — Other Ambulatory Visit (HOSPITAL_COMMUNITY): Payer: Self-pay

## 2023-07-19 MED ORDER — LATANOPROST 0.005 % OP SOLN
1.0000 [drp] | Freq: Every day | OPHTHALMIC | 11 refills | Status: DC
  Filled 2023-07-19: qty 2.5, 25d supply, fill #0

## 2023-07-19 MED ORDER — LATANOPROST 0.005 % OP SOLN
1.0000 [drp] | Freq: Every day | OPHTHALMIC | 11 refills | Status: AC
  Filled 2023-07-19: qty 2.5, 25d supply, fill #0
  Filled 2023-09-29: qty 5, 100d supply, fill #0
  Filled 2023-12-25: qty 5, 100d supply, fill #1
  Filled 2024-02-24 – 2024-03-04 (×2): qty 5, 100d supply, fill #2

## 2023-08-06 ENCOUNTER — Ambulatory Visit: Payer: Medicare Other | Admitting: Family Medicine

## 2023-08-06 ENCOUNTER — Ambulatory Visit: Payer: Medicare Other

## 2023-08-22 ENCOUNTER — Other Ambulatory Visit (HOSPITAL_COMMUNITY): Payer: Self-pay

## 2023-09-30 ENCOUNTER — Other Ambulatory Visit (HOSPITAL_COMMUNITY): Payer: Self-pay

## 2023-09-30 ENCOUNTER — Other Ambulatory Visit: Payer: Self-pay

## 2023-09-30 MED ORDER — CIPROFLOXACIN HCL 500 MG PO TABS
500.0000 mg | ORAL_TABLET | Freq: Two times a day (BID) | ORAL | 0 refills | Status: AC
  Filled 2023-09-30: qty 28, 14d supply, fill #0

## 2023-10-14 ENCOUNTER — Ambulatory Visit (INDEPENDENT_AMBULATORY_CARE_PROVIDER_SITE_OTHER)

## 2023-10-14 ENCOUNTER — Encounter: Payer: Self-pay | Admitting: Family Medicine

## 2023-10-14 ENCOUNTER — Ambulatory Visit: Admitting: Family Medicine

## 2023-10-14 ENCOUNTER — Other Ambulatory Visit (HOSPITAL_BASED_OUTPATIENT_CLINIC_OR_DEPARTMENT_OTHER): Payer: Self-pay

## 2023-10-14 VITALS — BP 118/78 | HR 63 | Temp 98.6°F | Resp 16 | Ht 65.0 in | Wt 130.6 lb

## 2023-10-14 VITALS — BP 118/78 | HR 63 | Temp 98.6°F | Ht 65.0 in | Wt 130.6 lb

## 2023-10-14 DIAGNOSIS — D649 Anemia, unspecified: Secondary | ICD-10-CM

## 2023-10-14 DIAGNOSIS — M85852 Other specified disorders of bone density and structure, left thigh: Secondary | ICD-10-CM | POA: Diagnosis not present

## 2023-10-14 DIAGNOSIS — F5101 Primary insomnia: Secondary | ICD-10-CM | POA: Diagnosis not present

## 2023-10-14 DIAGNOSIS — Z0000 Encounter for general adult medical examination without abnormal findings: Secondary | ICD-10-CM

## 2023-10-14 DIAGNOSIS — E559 Vitamin D deficiency, unspecified: Secondary | ICD-10-CM | POA: Diagnosis not present

## 2023-10-14 DIAGNOSIS — I10 Essential (primary) hypertension: Secondary | ICD-10-CM | POA: Diagnosis not present

## 2023-10-14 DIAGNOSIS — M85851 Other specified disorders of bone density and structure, right thigh: Secondary | ICD-10-CM | POA: Diagnosis not present

## 2023-10-14 DIAGNOSIS — Z1231 Encounter for screening mammogram for malignant neoplasm of breast: Secondary | ICD-10-CM

## 2023-10-14 MED ORDER — LISINOPRIL 10 MG PO TABS
10.0000 mg | ORAL_TABLET | Freq: Every day | ORAL | 1 refills | Status: AC
  Filled 2023-10-14 – 2023-12-25 (×2): qty 90, 90d supply, fill #0

## 2023-10-14 MED ORDER — ALPRAZOLAM 0.5 MG PO TABS
0.5000 mg | ORAL_TABLET | Freq: Every evening | ORAL | 1 refills | Status: AC
Start: 2023-10-14 — End: ?
  Filled 2023-10-14: qty 90, 90d supply, fill #0
  Filled 2024-01-13: qty 90, 90d supply, fill #1

## 2023-10-14 NOTE — Patient Instructions (Signed)
 Ms. Pesci , Thank you for taking time out of your busy schedule to complete your Annual Wellness Visit with me. I enjoyed our conversation and look forward to speaking with you again next year. I, as well as your care team,  appreciate your ongoing commitment to your health goals. Please review the following plan we discussed and let me know if I can assist you in the future. Your Game plan/ To Do List    Referrals: If you haven't heard from the office you've been referred to, please reach out to them at the phone provided.   Follow up Visits: We will see or speak with you next year for your Next Medicare AWV with our clinical staff Have you seen your provider in the last 6 months (3 months if uncontrolled diabetes)? Yes  Clinician Recommendations:  Aim for 30 minutes of exercise or brisk walking, 6-8 glasses of water, and 5 servings of fruits and vegetables each day.       This is a list of the screenings recommended for you:  Health Maintenance  Topic Date Due   Zoster (Shingles) Vaccine (1 of 2) Never done   COVID-19 Vaccine (4 - 2024-25 season) 10/28/2022   Medicare Annual Wellness Visit  06/01/2023   Flu Shot  09/27/2023   DTaP/Tdap/Td vaccine (1 - Tdap) 07/08/2024*   Colon Cancer Screening  07/27/2024   Mammogram  09/10/2024   Pneumococcal Vaccine for age over 38  Completed   DEXA scan (bone density measurement)  Completed   HPV Vaccine  Aged Out   Meningitis B Vaccine  Aged Out   Pneumococcal Vaccine  Discontinued   Hepatitis C Screening  Discontinued  *Topic was postponed. The date shown is not the original due date.    Advanced directives: (Copy Requested) Please bring a copy of your health care power of attorney and living will to the office to be added to your chart at your convenience. You can mail to [facility] [address] [place], [place] [address] or email to [email or web address] Advance Care Planning is important because it:  [x]  Makes  sure you receive the medical care that is consistent with your values, goals, and preferences  [x]  It provides guidance to your family and loved ones and reduces their decisional burden about whether or not they are making the right decisions based on your wishes.  Follow the link provided in your after visit summary or read over the paperwork we have mailed to you to help you started getting your Advance Directives in place. If you need assistance in completing these, please reach out to us  so that we can help you!  See attachments for Preventive Care and Fall Prevention Tips.

## 2023-10-14 NOTE — Patient Instructions (Signed)
It was very nice to see you today!  [phone] for mammogram,      PLEASE NOTE:  If you had any lab tests please let us know if you have not heard back within a few days. You may see your results on MyChart before we have a chance to review them but we will give you a call once they are reviewed by [place]. If we ordered any referrals today, please let us know if you have not heard from their office within the next week.   Please try these tips to maintain a healthy lifestyle:  Eat most of your calories during the day when you are active. Eliminate processed foods including packaged sweets (pies, cakes, cookies), reduce intake of potatoes, white bread, white pasta, and white rice. Look for whole grain options, oat flour or almond flour.  Each meal should contain half fruits/vegetables, one quarter protein, and one quarter carbs (no bigger than a computer mouse).  Cut down on sweet beverages. This includes juice, soda, and sweet tea. Also watch fruit intake, though this is a healthier sweet option, it still contains natural sugar! Limit to 3 servings daily.  Drink at least 1 glass of water with each meal and aim for at least 8 glasses per day  Exercise at least 150 minutes every week.

## 2023-10-14 NOTE — Addendum Note (Signed)
 Addended by: JAYLENE ELLOUISE DEL on: 10/14/2023 01:50 PM   Modules accepted: Orders

## 2023-10-14 NOTE — Progress Notes (Signed)
 Phone [phone]   Subjective:   Patient is a 68 y.o. female presenting for annual physical.    Chief Complaint  Patient presents with   Annual Exam    CPE    Annual-exercising Discussed the use of AI scribe software for clinical note transcription with the patient, who gave verbal consent to proceed.  History of Present Illness Dawn Casey is a 68 year old female who presents for an annual physical exam and blood pressure management.  She is currently taking lisinopril  10 mg daily for hypertension and propranolol  as needed for performance-related anxiety. Her home blood pressure readings are typically in the 110s to 120s over 70s to 80s. No headaches, dizziness, chest pain, shortness of breath, or cough.  She experiences persistent nasal congestion and uses Benadryl  daily or every other day. Allergy testing was negative, and she denies any infection. She has not tried nasal sprays like Flonase-but does not want to do nasal sprays.  She is on ciprofloxacin  for a finger infection, which is improving, with three days of treatment remaining.  She reports a history of issues with her right ear, including a sensation of it being plugged. She used Debrox as directed but reports brown discharge after cleaning her ear. No pain, itching, or hearing difficulties.  She experienced a 10-day period of illness with fever, nausea, and diarrhea, which she attributes to stress related to personal life events. She describes recent stress related to her personal life but currently denies depression or suicidal thoughts.  She takes alprazolam  nightly for sleep and finds it effective. No thoughts of suicide.  She exercises regularly, has a full gym at home, and consumes a diet rich in calcium, including milk, cheese, and protein shakes. She takes a multivitamin and is aware of her osteopenic status.  Anemia-needs reck.  No active bleeding    See problem oriented charting- ROS-  ROS: Gen: no fever, chills  Skin: no rash, itching ENT: no ear pain, ear drainage, nasal congestion, rhinorrhea, sinus pressure, sore throat Eyes: no blurry vision, double vision Resp: no cough, wheeze,SOB CV: no CP, palpitations, LE edema,  GI: no heartburn, n/v/d/c, abd pain GU: no dysuria, urgency, frequency, hematuria MSK: no joint pain, myalgias, back pain Neuro: no dizziness, headache, weakness, vertigo Psych: no depression, no SI   The following were reviewed and entered/updated in epic: Past Medical History:  Diagnosis Date   Allergy    Anxiety    Degenerative joint disease (DJD) of lumbar spine    Hip arthritis    Per rads.    Hypertension    Skin cancer 08/12/2015   Per patient's report.    Patient Active Problem List   Diagnosis Date Noted   Osteopenia of necks of both femurs 10/14/2023   Primary insomnia 07/09/2023   Vitamin D  deficiency 08/01/2022   Performance anxiety 08/01/2022   History of alcohol  abuse 03/20/2016   Other depressive episodes 03/20/2016   Degenerative joint disease 03/20/2016   Osteoarthritis of right hip 01/24/2016   Generalized anxiety disorder 01/16/2014   Anemia 01/16/2014   Essential hypertension 01/16/2014   Past Surgical History:  Procedure Laterality Date   APPENDECTOMY     BASAL CELL CARCINOMA EXCISION  08/17/2015   From face   JOINT REPLACEMENT  2018   TOTAL HIP ARTHROPLASTY Right 01/24/2017   Procedure: RIGHT TOTAL HIP ARTHROPLASTY ANTERIOR APPROACH;  Surgeon: Fidel Rogue, MD;  Location: [facility];  Service: Orthopedics;  Laterality: Right;  Needs RNFA  Family History  Problem Relation Age of Onset   Hypertension Mother    Alcohol  abuse Father    Diabetes Father    Hypertension Father     Medications- reviewed and updated Current Outpatient Medications  Medication Sig Dispense Refill   Azelaic Acid  15 % gel Apply a thin layer on face in the morning and at bedtime as directed. 50 g 1   BIOTIN PO Take 3 each  daily by mouth. Hair,  skin and nails gummy     ciprofloxacin  (CIPRO ) 500 MG tablet Take 1 tablet (500 mg total) by mouth 2 (two) times daily for 14 days. 28 tablet 0   latanoprost  (XALATAN ) 0.005 % ophthalmic solution Apply 1 drop to eye.     latanoprost  (XALATAN ) 0.005 % ophthalmic solution Place 1 drop into both eyes at bedtime. 2.5 mL 11   Multiple Vitamins-Minerals (ADULT GUMMY PO) Take 2 tablets daily by mouth.     propranolol  (INDERAL ) 20 MG tablet Take 1 tablet (20 mg total) by mouth 2 (two) times daily as needed. 30 tablet 3   tretinoin  (RETIN-A ) 0.05 % cream Apply topically.     ALPRAZolam  (XANAX ) 0.5 MG tablet Take 1 tablet (0.5 mg total) by mouth nightly as needed for sleep. NEEDS OV FOR FURTHER REFILLS. 90 tablet 1   lisinopril  (ZESTRIL ) 10 MG tablet Take 1 tablet (10 mg) by mouth daily. 90 tablet 1   No current facility-administered medications for this visit.    Allergies-reviewed and updated Allergies  Allergen Reactions   Fluoxetine Hives and Rash   Sulfa Antibiotics Rash   Tramadol Nausea And Vomiting    Causes vomiting and bleeding esophageal  Other Reaction(s): GI Intolerance   Prozac [Fluoxetine Hcl] Hives   Adhesive [Tape] Rash   Amoxicillin Rash    Has patient had a PCN reaction causing immediate rash, facial/tongue/throat swelling, SOB or lightheadedness with hypotension: Unknown Has patient had a PCN reaction causing severe rash involving mucus membranes or skin necrosis: Unknown Has patient had a PCN reaction that required hospitalization: Unknown Has patient had a PCN reaction occurring within the last 10 years: No If all of the above answers are NO, then may proceed with Cephalosporin use.    Erythromycin Rash    Social History   Social History Narrative   2 step and 2 adopted      retired   Objective  Objective:  BP 118/78   Pulse 63   Temp 98.6 F (37 C) (Temporal)   Resp 16   Ht 5' 5 (1.651 m)   Wt 130 lb 9.6 oz (59.2 kg)   SpO2 99%    BMI 21.73 kg/m  Physical Exam  Gen: WDWN NAD HEENT: NCAT, conjunctiva not injected, sclera nonicteric TM not visible d/t wax B, OP moist, no exudates  NECK:  supple, no thyromegaly, no nodes, no carotid bruits CARDIAC: RRR, S1S2+, no murmur. DP 2+B LUNGS: CTAB. No wheezes ABDOMEN:  BS+, soft, NTND, No HSM, no masses EXT:  no edema MSK: no gross abnormalities. MS 5/5 all 4 NEURO: A&O x3.  CN II-XII intact.  PSYCH: normal mood. Good eye contact     Assessment and Plan   Health Maintenance counseling: 1. Anticipatory guidance: Patient counseled regarding regular dental exams q6 months, eye exams,  avoiding smoking and second hand smoke, limiting alcohol  to 1 beverage per day, no illicit drugs.   2. Risk factor reduction:  Advised patient of need for regular exercise and diet rich and fruits and  vegetables to reduce risk of heart attack and stroke. Exercise- +.  Wt Readings from Last 3 Encounters:  10/14/23 130 lb 9.6 oz (59.2 kg)  10/14/23 130 lb 9.6 oz (59.2 kg)  08/01/22 130 lb 4 oz (59.1 kg)   3. Immunizations/screenings/ancillary studies Immunization History  Administered Date(s) Administered   PFIZER(Purple Top)SARS-COV-2 Vaccination 05/25/2019, 06/16/2019, 01/30/2020   PNEUMOCOCCAL CONJUGATE-20 06/01/2022   Health Maintenance Due  Topic Date Due   INFLUENZA VACCINE  09/27/2023    4. Cervical cancer screening- 2 yrs 5. Breast cancer screening-  mammogram sch 6. Colon cancer screening - due 07/27/24 7. Skin cancer screening- advised regular sunscreen use. Denies worrisome, changing, or new skin lesions.  8. Birth control/STD check- n/a 9. Osteoporosis screening- due 1 yr 10. Smoking associated screening - non smoker  Wellness examination  Essential hypertension -     Lisinopril ; Take 1 tablet (10 mg) by mouth daily.  Dispense: 90 tablet; Refill: 1 -     CBC with Differential/Platelet -     Comprehensive metabolic panel with GFR -     Lipid panel -      TSH  Anemia, unspecified type -     CBC with Differential/Platelet -     IBC + Ferritin -     Vitamin B12  Vitamin D  deficiency -     VITAMIN D  25 Hydroxy (Vit-D Deficiency, Fractures)  Osteopenia of necks of both femurs  Primary insomnia -     ALPRAZolam ; Take 1 tablet (0.5 mg total) by mouth nightly as needed for sleep. NEEDS OV FOR FURTHER REFILLS.  Dispense: 90 tablet; Refill: 1   Wellness-get mamm done Assessment and Plan Assessment & Plan Adult Wellness Visit   During her routine adult wellness visit, she reported regular exercise and a diet rich in calcium, including milk, cheese, and protein shakes. The efficacy of multivitamin use remains uncertain. Encourage regular exercise and a balanced diet. Continue current calcium intake with no changes to multivitamin use.  Hypertension   Her hypertension is well-controlled with lisinopril  10 mg daily, and home blood pressure readings are about 110s-120s/80s. She experiences no symptoms such as headache, dizziness, chest pain, or shortness of breath. Continue lisinopril  10 mg daily, renew the prescription, and monitor blood pressure regularly.  Chronic nasal congestion   She experiences chronic nasal congestion without identified allergies or infections, taking Benadryl  daily or every other day. Flonase is unsuitable due to glaucoma. Astelin, an antihistamine nasal spray, is recommended as an alternative. Declines Astelin nasal spray and consider an ENT referral if symptoms persist.  Bilat ear cerumen impaction,-asymptomatic Right ear cerumen impaction was previously treated with Debrox and a bulb syringe. Brown discharge is noted, but there is no pain, itching, or hearing loss. A previous telemedicine consultation addressed ear plugging.  Declines irrig d/t risks.  Chronic insomnia   Chronic insomnia is managed with nightly Xanax  use. No suicidal thoughts are reported. She is aware of the need for a six-month follow-up due to  controlled substance regulations. Renew the Xanax  prescription and schedule a follow-up in six months. PDMP checked  Anemia, unspecified   She has unspecified anemia and requests repeat testing to monitor stability. Previous tests included iron and B12 levels. Order repeat labs, including iron and B12 levels.  Osteopenia   Osteopenia was diagnosed previously, with the last bone density test performed on 07/05/2022. She exercises regularly and consumes adequate calcium. Repeat the bone density test next year.  Depression, in remission   Her depression  is in remission with no current symptoms or suicidal ideation. Recent stress due to personal circumstances is improving. Monitor mental health status and encourage continuation of therapy for her husband.    Recommended follow up: Return in about 6 months (around 04/15/2024) for chronic follow-up.  Lab/Order associations:4hr fasting  Jenkins CHRISTELLA Carrel, MD

## 2023-10-14 NOTE — Progress Notes (Addendum)
 Subjective:   Dawn Casey is a 68 y.o. who presents for a Medicare Wellness preventive visit.  As a reminder, Annual Wellness Visits don't include a physical exam, and some assessments may be limited, especially if this visit is performed virtually. We may recommend an in-person follow-up visit with your provider if needed.  Visit Complete: In person    Persons Participating in Visit: Patient.  AWV Questionnaire: No: Patient Medicare AWV questionnaire was not completed prior to this visit.  Cardiac Risk Factors include: advanced age (>27men, >42 women);hypertension     Objective:    Today's Vitals   10/14/23 1330  BP: 118/78  Pulse: 63  Temp: 98.6 F (37 C)  SpO2: 99%  Weight: 130 lb 9.6 oz (59.2 kg)  Height: 5' 5 (1.651 m)   Body mass index is 21.73 kg/m.     10/14/2023    1:40 PM 06/28/2021   10:21 PM 06/26/2021    8:53 AM 01/24/2017    6:36 PM 01/24/2017    6:35 PM 01/24/2017    6:34 PM 01/24/2017   12:00 PM  Advanced Directives  Does Patient Have a Medical Advance Directive? Yes No No  Yes;No  No  No   Type of Estate agent of [place];Living will        Does patient want to make changes to medical advance directive?    No - Patient declined  No - Patient declined  Yes (Inpatient - patient requests chaplain consult to change a medical advance directive)  Yes (Inpatient - patient requests chaplain consult to change a medical advance directive)   Copy of Healthcare Power of Attorney in Chart? No - copy requested        Would patient like information on creating a medical advance directive?  No - Patient declined No - Patient declined No - Patient declined   Yes (Inpatient - patient requests chaplain consult to create a medical advance directive)  --      Data saved with a previous flowsheet row definition    Current Medications (verified) Outpatient Encounter Medications as of 10/14/2023  Medication Sig   ALPRAZolam  (XANAX ) 0.5 MG  tablet Take 1 tablet (0.5 mg total) by mouth nightly as needed for sleep. NEEDS OV FOR FURTHER REFILLS.   Azelaic Acid  15 % gel Apply a thin layer on face in the morning and at bedtime as directed.   BIOTIN PO Take 3 each daily by mouth. Hair,  skin and nails gummy   ciprofloxacin  (CIPRO ) 500 MG tablet Take 1 tablet (500 mg total) by mouth 2 (two) times daily for 14 days.   latanoprost  (XALATAN ) 0.005 % ophthalmic solution Apply 1 drop to eye.   lisinopril  (ZESTRIL ) 10 MG tablet Take 1 tablet (10 mg) by mouth daily.   Multiple Vitamins-Minerals (ADULT GUMMY PO) Take 2 tablets daily by mouth.   propranolol  (INDERAL ) 20 MG tablet Take 1 tablet (20 mg total) by mouth 2 (two) times daily as needed.   tretinoin  (RETIN-A ) 0.05 % cream Apply topically.   latanoprost  (XALATAN ) 0.005 % ophthalmic solution Place 1 drop into both eyes at bedtime.   [DISCONTINUED] latanoprost  (XALATAN ) 0.005 % ophthalmic solution Place 1 drop into both eyes at bedtime.   No facility-administered encounter medications on file as of 10/14/2023.    Allergies (verified) Fluoxetine, Sulfa antibiotics, Tramadol, Prozac [fluoxetine hcl], Adhesive [tape], Amoxicillin, and Erythromycin   History: Past Medical History:  Diagnosis Date   Allergy    Anxiety  Degenerative joint disease (DJD) of lumbar spine    Hip arthritis    Per rads.    Hypertension    Skin cancer 08/12/2015   Per patient's report.    Past Surgical History:  Procedure Laterality Date   APPENDECTOMY     BASAL CELL CARCINOMA EXCISION  08/17/2015   From face   JOINT REPLACEMENT  2018   TOTAL HIP ARTHROPLASTY Right 01/24/2017   Procedure: RIGHT TOTAL HIP ARTHROPLASTY ANTERIOR APPROACH;  Surgeon: Fidel Rogue, MD;  Location: [facility];  Service: Orthopedics;  Laterality: Right;  Needs RNFA   Family History  Problem Relation Age of Onset   Hypertension Mother    Alcohol  abuse Father    Diabetes Father    Hypertension Father    Social History    Socioeconomic History   Marital status: Married    Spouse name: Not on file   Number of children: 4   Years of education: Not on file   Highest education level: Not on file  Occupational History   Not on file  Tobacco Use   Smoking status: Never   Smokeless tobacco: Never  Vaping Use   Vaping status: Never Used  Substance and Sexual Activity   Alcohol  use: Yes    Alcohol /week: 16.0 standard drinks of alcohol     Types: 8 Glasses of wine, 8 Standard drinks or equivalent per week    Comment: Socially-0-2/day   Drug use: No   Sexual activity: Yes    Birth control/protection: None    Comment: I'm 67.  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  (10/14/2023)   Overall Financial Resource Strain (CARDIA)    Difficulty of Paying Living Expenses: Not hard at all  Food Insecurity: No Food Insecurity (10/14/2023)   Hunger Vital Sign    Worried About Running Out of Food in the Last Year: Never true    Ran Out of Food in the Last Year: Never true  Transportation Needs: No Transportation Needs (10/14/2023)   PRAPARE - Administrator, Civil Service (Medical): No    Lack of Transportation (Non-Medical): No  Physical Activity: Insufficiently Active (10/14/2023)   Exercise Vital Sign    Days of Exercise per Week: 3 days    Minutes of Exercise per Session: 40 min  Stress: Stress Concern Present (10/14/2023)   [employer] of Occupational Health - Occupational Stress Questionnaire    Feeling of Stress: Very much  Social Connections: Moderately Isolated (10/14/2023)   Social Connection and Isolation Panel    Frequency of Communication with Friends and Family: Once a week    Frequency of Social Gatherings with Friends and Family: More than three times a week    Attends Religious Services: Never    Database administrator or Organizations: No    Attends  Engineer, structural: Never    Marital Status: Married    Tobacco Counseling Counseling given: Not Answered    Clinical Intake:  Pre-visit preparation completed: Yes  Pain : No/denies pain     BMI - recorded: 21.73 Nutritional Status: BMI of 19-24  Normal Nutritional Risks: None Diabetes: No  Lab Results  Component Value Date   HGBA1C 4.8 08/01/2022     How often do you need to have someone help you when you read instructions, pamphlets,  or other written materials from your doctor or pharmacy?: 1 - Never  Interpreter Needed?: No  Information entered by :: Ellouise Haws, LPN   Activities of Daily Living     10/14/2023    1:36 PM  In your present state of health, do you have any difficulty performing the following activities:  Hearing? 0  Vision? 0  Difficulty concentrating or making decisions? 0  Walking or climbing stairs? 0  Dressing or bathing? 0  Doing errands, shopping? 0  Preparing Food and eating ? N  Using the Toilet? N  In the past six months, have you accidently leaked urine? N  Do you have problems with loss of bowel control? N  Managing your Medications? N  Managing your Finances? N  Housekeeping or managing your Housekeeping? N    Patient Care Team: Wendolyn Jenkins Jansky, MD as PCP - General (Family Medicine)  I have updated your Care Teams any recent Medical Services you may have received from other providers in the past year.     Assessment:   This is a routine wellness examination for [place].  Hearing/Vision screen Hearing Screening - Comments:: Pt denies any hearing issues  Vision Screening - Comments:: Wears rx glasses - up to date with routine eye exams with Dr Inocente Hensil and Dr Geneva    Goals Addressed             This Visit's Progress    Patient Stated       Maintain health and activity        Depression Screen     10/14/2023    1:37 PM 08/01/2022    2:19 PM 06/01/2022   11:24 AM 07/19/2017   11:15 AM 07/11/2017     2:26 PM 07/05/2017   10:06 AM 07/02/2017    3:14 PM  PHQ 2/9 Scores  PHQ - 2 Score 0 0 0 0 0 0 0  PHQ- 9 Score  0 1        Fall Risk     10/14/2023    1:40 PM 08/01/2022    2:19 PM 06/01/2022   11:24 AM 07/19/2017   11:15 AM 07/11/2017    2:26 PM  Fall Risk   Falls in the past year? 1 1 1  No  Yes   Number falls in past yr: 1 1 1  2  or more   Injury with Fall? 1 1 1   Yes   Comment facial injury bruised      Risk for fall due to : History of fall(s) Impaired balance/gait No Fall Risks    Follow up Falls prevention discussed Falls prevention discussed Falls evaluation completed       Data saved with a previous flowsheet row definition    MEDICARE RISK AT HOME:  Medicare Risk at Home Any stairs in or around the home?: Yes If so, are there any without handrails?: No Home free of loose throw rugs in walkways, pet beds, electrical cords, etc?: Yes Adequate lighting in your home to reduce risk of falls?: Yes Life alert?: No Use of a cane, Bottger or w/c?: No Grab bars in the bathroom?: Yes Shower chair or bench in shower?: No Elevated toilet seat or a handicapped toilet?: No  TIMED UP AND GO:  Was the test performed?  Yes  Length of time to ambulate 10 feet: 10 sec Gait steady and fast without use of assistive device  Cognitive Function: 6CIT completed        10/14/2023  1:41 PM  6CIT Screen  What Year? 0 points  What month? 0 points  What time? 0 points  Count back from 20 0 points  Months in reverse 0 points  Repeat phrase 0 points  Total Score 0 points    Immunizations Immunization History  Administered Date(s) Administered   PFIZER(Purple Top)SARS-COV-2 Vaccination 05/25/2019, 06/16/2019, 01/30/2020   PNEUMOCOCCAL CONJUGATE-20 06/01/2022    Screening Tests Health Maintenance  Topic Date Due   Zoster Vaccines- Shingrix (1 of 2) Never done   COVID-19 Vaccine (4 - 2024-25 season) 10/28/2022   INFLUENZA VACCINE  09/27/2023   DTaP/Tdap/Td (1 - Tdap)  07/08/2024 (Originally 06/20/1974)   Colonoscopy  07/27/2024   MAMMOGRAM  09/10/2024   Medicare Annual Wellness (AWV)  10/13/2024   Pneumococcal Vaccine: 50+ Years  Completed   DEXA SCAN  Completed   HPV VACCINES  Aged Out   Meningococcal B Vaccine  Aged Out   Pneumococcal Vaccine  Discontinued   Hepatitis C Screening  Discontinued    Health Maintenance  Health Maintenance Due  Topic Date Due   Zoster Vaccines- Shingrix (1 of 2) Never done   COVID-19 Vaccine (4 - 2024-25 season) 10/28/2022   INFLUENZA VACCINE  09/27/2023   Health Maintenance Items Addressed: See Nurse Notes at the end of this note  Additional Screening:  Vision Screening: Recommended annual ophthalmology exams for early detection of glaucoma and other disorders of the eye. Would you like a referral to an eye doctor? No    Dental Screening: Recommended annual dental exams for proper oral hygiene  Community Resource Referral / Chronic Care Management: CRR required this visit?  No   CCM required this visit?  No   Plan:    I have personally reviewed and noted the following in the patient's chart:   Medical and social history Use of alcohol , tobacco or illicit drugs  Current medications and supplements including opioid prescriptions. Patient is not currently taking opioid prescriptions. Functional ability and status Nutritional status Physical activity Advanced directives List of other physicians Hospitalizations, surgeries, and ER visits in previous 12 months Vitals Screenings to include cognitive, depression, and falls Referrals and appointments  In addition, I have reviewed and discussed with patient certain preventive protocols, quality metrics, and best practice recommendations. A written personalized care plan for preventive services as well as general preventive health recommendations were provided to patient.   Ellouise VEAR Haws, LPN   1/81/7974   After Visit Summary: (MyChart) Due to this  being a telephonic visit, the after visit summary with patients personalized plan was offered to patient via MyChart   Notes: Nothing significant to report at this time.

## 2023-10-15 ENCOUNTER — Ambulatory Visit: Payer: Self-pay | Admitting: Family Medicine

## 2023-10-15 ENCOUNTER — Telehealth: Payer: Self-pay | Admitting: *Deleted

## 2023-10-15 ENCOUNTER — Encounter: Payer: Self-pay | Admitting: Family Medicine

## 2023-10-15 LAB — LIPID PANEL
Cholesterol: 187 mg/dL (ref 0–200)
HDL: 63.2 mg/dL (ref >39.00)
LDL Cholesterol: 96 mg/dL (ref 0–99)
NonHDL: 123.86
Total CHOL/HDL Ratio: 3
Triglycerides: 138 mg/dL (ref 0.0–149.0)
VLDL: 27.6 mg/dL (ref 0.0–40.0)

## 2023-10-15 LAB — COMPREHENSIVE METABOLIC PANEL WITH GFR
ALT: 39 U/L — ABNORMAL HIGH (ref 0–35)
AST: 55 U/L — ABNORMAL HIGH (ref 0–37)
Albumin: 4.4 g/dL (ref 3.5–5.2)
Alkaline Phosphatase: 62 U/L (ref 39–117)
BUN: 10 mg/dL (ref 6–23)
CO2: 25 meq/L (ref 19–32)
Calcium: 9.5 mg/dL (ref 8.4–10.5)
Chloride: 100 meq/L (ref 96–112)
Creatinine, Ser: 0.62 mg/dL (ref 0.40–1.20)
GFR: 92.21 mL/min (ref >60.00)
Glucose, Bld: 84 mg/dL (ref 70–99)
Potassium: 3.9 meq/L (ref 3.5–5.1)
Sodium: 135 meq/L (ref 135–145)
Total Bilirubin: 0.5 mg/dL (ref 0.2–1.2)
Total Protein: 6.8 g/dL (ref 6.0–8.3)

## 2023-10-15 LAB — IBC + FERRITIN
Ferritin: 440.8 ng/mL — ABNORMAL HIGH (ref 10.0–291.0)
Iron: 71 ug/dL (ref 42–145)
Saturation Ratios: 22.8 % (ref 20.0–50.0)
TIBC: 310.8 ug/dL (ref 250.0–450.0)
Transferrin: 222 mg/dL (ref 212.0–360.0)

## 2023-10-15 LAB — CBC WITH DIFFERENTIAL/PLATELET
Basophils Absolute: 0.1 K/uL (ref 0.0–0.1)
Basophils Relative: 1.2 % (ref 0.0–3.0)
Eosinophils Absolute: 0.2 K/uL (ref 0.0–0.7)
Eosinophils Relative: 2.5 % (ref 0.0–5.0)
HCT: 39.1 % (ref 36.0–46.0)
Hemoglobin: 12.9 g/dL (ref 12.0–15.0)
Lymphocytes Relative: 21.4 % (ref 12.0–46.0)
Lymphs Abs: 1.5 K/uL (ref 0.7–4.0)
MCHC: 33 g/dL (ref 30.0–36.0)
MCV: 101.8 fl — ABNORMAL HIGH (ref 78.0–100.0)
Monocytes Absolute: 0.5 K/uL (ref 0.1–1.0)
Monocytes Relative: 7.2 % (ref 3.0–12.0)
Neutro Abs: 4.8 K/uL (ref 1.4–7.7)
Neutrophils Relative %: 67.7 % (ref 43.0–77.0)
Platelets: 250 K/uL (ref 150.0–400.0)
RBC: 3.84 Mil/uL — ABNORMAL LOW (ref 3.87–5.11)
RDW: 13.1 % (ref 11.5–15.5)
WBC: 7.1 K/uL (ref 4.0–10.5)

## 2023-10-15 LAB — TSH: TSH: 0.7 u[IU]/mL (ref 0.35–5.50)

## 2023-10-15 LAB — VITAMIN D 25 HYDROXY (VIT D DEFICIENCY, FRACTURES): VITD: 100.1 ng/mL — ABNORMAL HIGH (ref 30.00–100.00)

## 2023-10-15 LAB — VITAMIN B12: Vitamin B-12: 507 pg/mL (ref 211–911)

## 2023-10-15 NOTE — Progress Notes (Signed)
 Liver tests may be a little elevated from the lite beer or the antibiotics.  Wait 3 week to reck lft's and be fasting Anemia resolved.  Ferritin is elevated which could be inflammation anywhere but esp since on antibiotics.  Reck ferritin in 3 wks Vitamin D  is too high-needs to stop supplements of vitamin D 

## 2023-10-15 NOTE — Telephone Encounter (Signed)
 Copied from CRM [id number]. Topic: Clinical - Lab/Test Results >> Oct 15, 2023  3:13 PM Franky GRADE wrote: Reason for CRM: Patient is calling to review lab results, she saw the results on Mychart and had concerns on the liver function.

## 2023-10-15 NOTE — Telephone Encounter (Signed)
 Patient stated that she did not fast completely before labs, may have eaten 3 hours before and had a light beer with lunch that maybe why her liver tests were elevated. She is stating that she want to retake test in a few days fasting. Patient advised that message will be sent and that once pcp has reviewed results and make recommendations, we will let her know.

## 2023-10-16 ENCOUNTER — Other Ambulatory Visit (HOSPITAL_BASED_OUTPATIENT_CLINIC_OR_DEPARTMENT_OTHER): Payer: Self-pay

## 2023-10-16 ENCOUNTER — Other Ambulatory Visit: Payer: Self-pay | Admitting: *Deleted

## 2023-10-16 DIAGNOSIS — D649 Anemia, unspecified: Secondary | ICD-10-CM

## 2023-10-16 DIAGNOSIS — R7989 Other specified abnormal findings of blood chemistry: Secondary | ICD-10-CM

## 2023-10-16 MED ORDER — CLINDAMYCIN PHOSPHATE 1 % EX SOLN
1.0000 | Freq: Two times a day (BID) | CUTANEOUS | 6 refills | Status: AC
  Filled 2023-10-16: qty 60, 30d supply, fill #0

## 2023-10-16 NOTE — Telephone Encounter (Signed)
 Patient viewed results and recommendations via mychart. Patient sent mychart messages concerning labs.

## 2023-10-21 ENCOUNTER — Other Ambulatory Visit (HOSPITAL_BASED_OUTPATIENT_CLINIC_OR_DEPARTMENT_OTHER): Payer: Self-pay

## 2023-10-21 MED ORDER — BOOSTRIX 5-2.5-18.5 LF-MCG/0.5 IM SUSY
0.5000 mL | PREFILLED_SYRINGE | Freq: Once | INTRAMUSCULAR | 0 refills | Status: AC
  Filled 2023-10-21: qty 0.5, 1d supply, fill #0

## 2023-10-23 DIAGNOSIS — H52223 Regular astigmatism, bilateral: Secondary | ICD-10-CM | POA: Diagnosis not present

## 2023-10-23 DIAGNOSIS — H524 Presbyopia: Secondary | ICD-10-CM | POA: Diagnosis not present

## 2023-10-23 DIAGNOSIS — H401122 Primary open-angle glaucoma, left eye, moderate stage: Secondary | ICD-10-CM | POA: Diagnosis not present

## 2023-10-23 DIAGNOSIS — H5213 Myopia, bilateral: Secondary | ICD-10-CM | POA: Diagnosis not present

## 2023-10-23 DIAGNOSIS — Z460 Encounter for fitting and adjustment of spectacles and contact lenses: Secondary | ICD-10-CM | POA: Diagnosis not present

## 2023-10-23 DIAGNOSIS — H40001 Preglaucoma, unspecified, right eye: Secondary | ICD-10-CM | POA: Diagnosis not present

## 2023-11-01 ENCOUNTER — Encounter: Payer: Self-pay | Admitting: Family Medicine

## 2023-11-02 ENCOUNTER — Other Ambulatory Visit: Payer: Self-pay | Admitting: Family Medicine

## 2023-11-03 ENCOUNTER — Other Ambulatory Visit (HOSPITAL_COMMUNITY): Payer: Self-pay

## 2023-11-03 ENCOUNTER — Encounter: Payer: Self-pay | Admitting: Family Medicine

## 2023-11-03 MED ORDER — PROPRANOLOL HCL 20 MG PO TABS
20.0000 mg | ORAL_TABLET | Freq: Two times a day (BID) | ORAL | 3 refills | Status: AC | PRN
  Filled 2023-11-03: qty 30, 15d supply, fill #0
  Filled 2023-12-25: qty 30, 15d supply, fill #1

## 2023-11-04 ENCOUNTER — Other Ambulatory Visit (HOSPITAL_COMMUNITY): Payer: Self-pay

## 2023-11-04 ENCOUNTER — Other Ambulatory Visit: Payer: Self-pay

## 2023-11-08 ENCOUNTER — Other Ambulatory Visit: Payer: Self-pay

## 2023-11-08 ENCOUNTER — Other Ambulatory Visit (HOSPITAL_COMMUNITY): Payer: Self-pay

## 2023-11-08 DIAGNOSIS — L821 Other seborrheic keratosis: Secondary | ICD-10-CM | POA: Diagnosis not present

## 2023-11-08 DIAGNOSIS — Z85828 Personal history of other malignant neoplasm of skin: Secondary | ICD-10-CM | POA: Diagnosis not present

## 2023-11-08 DIAGNOSIS — D239 Other benign neoplasm of skin, unspecified: Secondary | ICD-10-CM | POA: Diagnosis not present

## 2023-11-08 DIAGNOSIS — L658 Other specified nonscarring hair loss: Secondary | ICD-10-CM | POA: Diagnosis not present

## 2023-11-08 DIAGNOSIS — L814 Other melanin hyperpigmentation: Secondary | ICD-10-CM | POA: Diagnosis not present

## 2023-11-08 DIAGNOSIS — D1801 Hemangioma of skin and subcutaneous tissue: Secondary | ICD-10-CM | POA: Diagnosis not present

## 2023-11-08 DIAGNOSIS — Z808 Family history of malignant neoplasm of other organs or systems: Secondary | ICD-10-CM | POA: Diagnosis not present

## 2023-11-08 DIAGNOSIS — L719 Rosacea, unspecified: Secondary | ICD-10-CM | POA: Diagnosis not present

## 2023-11-08 MED ORDER — FINASTERIDE 5 MG PO TABS
2.5000 mg | ORAL_TABLET | Freq: Every day | ORAL | 3 refills | Status: AC
  Filled 2023-11-08: qty 45, 90d supply, fill #0

## 2023-12-18 ENCOUNTER — Other Ambulatory Visit (HOSPITAL_COMMUNITY): Payer: Self-pay

## 2023-12-18 DIAGNOSIS — L719 Rosacea, unspecified: Secondary | ICD-10-CM | POA: Diagnosis not present

## 2023-12-18 DIAGNOSIS — R5383 Other fatigue: Secondary | ICD-10-CM | POA: Diagnosis not present

## 2023-12-19 ENCOUNTER — Other Ambulatory Visit: Payer: Self-pay

## 2023-12-19 ENCOUNTER — Other Ambulatory Visit (HOSPITAL_COMMUNITY): Payer: Self-pay

## 2023-12-19 MED ORDER — METRONIDAZOLE 0.75 % EX LOTN
1.0000 "application " | TOPICAL_LOTION | Freq: Two times a day (BID) | CUTANEOUS | 0 refills | Status: AC
  Filled 2023-12-19 – 2023-12-20 (×2): qty 59, 30d supply, fill #0
  Filled 2023-12-24: qty 59, 20d supply, fill #0

## 2023-12-20 ENCOUNTER — Other Ambulatory Visit: Payer: Self-pay

## 2023-12-20 ENCOUNTER — Other Ambulatory Visit (HOSPITAL_COMMUNITY): Payer: Self-pay

## 2023-12-24 ENCOUNTER — Other Ambulatory Visit: Payer: Self-pay

## 2023-12-24 ENCOUNTER — Other Ambulatory Visit (HOSPITAL_COMMUNITY): Payer: Self-pay

## 2023-12-24 ENCOUNTER — Encounter: Payer: Self-pay | Admitting: Pharmacist

## 2023-12-24 MED ORDER — METRONIDAZOLE 0.75 % EX CREA
1.0000 | TOPICAL_CREAM | Freq: Two times a day (BID) | CUTANEOUS | 2 refills | Status: AC
  Filled 2023-12-24 – 2023-12-25 (×2): qty 45, 23d supply, fill #0

## 2023-12-25 ENCOUNTER — Other Ambulatory Visit: Payer: Self-pay

## 2023-12-25 ENCOUNTER — Other Ambulatory Visit (HOSPITAL_COMMUNITY): Payer: Self-pay

## 2024-01-13 ENCOUNTER — Other Ambulatory Visit: Payer: Self-pay

## 2024-01-13 ENCOUNTER — Other Ambulatory Visit (HOSPITAL_BASED_OUTPATIENT_CLINIC_OR_DEPARTMENT_OTHER): Payer: Self-pay

## 2024-01-15 ENCOUNTER — Other Ambulatory Visit (HOSPITAL_BASED_OUTPATIENT_CLINIC_OR_DEPARTMENT_OTHER): Payer: Self-pay

## 2024-01-15 ENCOUNTER — Other Ambulatory Visit: Payer: Self-pay

## 2024-01-15 MED ORDER — DOXYCYCLINE MONOHYDRATE 50 MG PO CAPS
ORAL_CAPSULE | ORAL | 1 refills | Status: AC
  Filled 2024-01-15: qty 30, 30d supply, fill #0

## 2024-02-24 ENCOUNTER — Other Ambulatory Visit (HOSPITAL_COMMUNITY): Payer: Self-pay

## 2024-03-16 ENCOUNTER — Encounter: Payer: Self-pay | Admitting: Family Medicine

## 2024-03-23 ENCOUNTER — Other Ambulatory Visit: Payer: Self-pay | Admitting: Family Medicine

## 2024-03-23 ENCOUNTER — Encounter: Payer: Self-pay | Admitting: Family Medicine

## 2024-03-23 DIAGNOSIS — H401122 Primary open-angle glaucoma, left eye, moderate stage: Secondary | ICD-10-CM | POA: Insufficient documentation

## 2024-04-15 ENCOUNTER — Ambulatory Visit: Admitting: Family Medicine

## 2024-10-14 ENCOUNTER — Encounter: Admitting: Family Medicine

## 2024-10-14 ENCOUNTER — Ambulatory Visit

## 2024-10-15 ENCOUNTER — Ambulatory Visit
